# Patient Record
Sex: Female | Born: 1965 | Race: White | Hispanic: No | Marital: Married | Smoking: Never smoker
Health system: Southern US, Community
[De-identification: ages and names within clinical notes are randomized; demographics above are authoritative.]

## PROBLEM LIST (undated history)

## (undated) DIAGNOSIS — M549 Dorsalgia, unspecified: Secondary | ICD-10-CM

## (undated) DIAGNOSIS — G8929 Other chronic pain: Secondary | ICD-10-CM

## (undated) DIAGNOSIS — Q249 Congenital malformation of heart, unspecified: Secondary | ICD-10-CM

## (undated) DIAGNOSIS — T7840XA Allergy, unspecified, initial encounter: Secondary | ICD-10-CM

## (undated) DIAGNOSIS — D259 Leiomyoma of uterus, unspecified: Secondary | ICD-10-CM

## (undated) HISTORY — DX: Congenital malformation of heart, unspecified: Q24.9

## (undated) HISTORY — DX: Allergy, unspecified, initial encounter: T78.40XA

---
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
## 1997-09-08 HISTORY — PX: HYSTEROSCOPY: SHX211

## 2002-10-03 ENCOUNTER — Emergency Department (HOSPITAL_COMMUNITY): Admission: EM | Admit: 2002-10-03 | Discharge: 2002-10-03 | Payer: Self-pay | Admitting: Emergency Medicine

## 2010-05-26 LAB — HM PAP SMEAR

## 2011-03-26 ENCOUNTER — Ambulatory Visit (INDEPENDENT_AMBULATORY_CARE_PROVIDER_SITE_OTHER): Payer: BC Managed Care – PPO | Admitting: Family Medicine

## 2011-03-26 ENCOUNTER — Encounter: Payer: Self-pay | Admitting: Family Medicine

## 2011-03-26 DIAGNOSIS — J309 Allergic rhinitis, unspecified: Secondary | ICD-10-CM | POA: Insufficient documentation

## 2011-03-26 DIAGNOSIS — J3089 Other allergic rhinitis: Secondary | ICD-10-CM | POA: Insufficient documentation

## 2011-03-26 DIAGNOSIS — H1045 Other chronic allergic conjunctivitis: Secondary | ICD-10-CM

## 2011-03-26 DIAGNOSIS — J45909 Unspecified asthma, uncomplicated: Secondary | ICD-10-CM | POA: Insufficient documentation

## 2011-03-26 DIAGNOSIS — F411 Generalized anxiety disorder: Secondary | ICD-10-CM

## 2011-03-26 DIAGNOSIS — F419 Anxiety disorder, unspecified: Secondary | ICD-10-CM

## 2011-03-26 DIAGNOSIS — H1010 Acute atopic conjunctivitis, unspecified eye: Secondary | ICD-10-CM

## 2011-03-26 MED ORDER — OLOPATADINE HCL 0.1 % OP SOLN: 1.0000 [drp] | Freq: Two times a day (BID) | OPHTHALMIC | Status: AC

## 2011-03-26 MED ORDER — FLUTICASONE FUROATE 27.5 MCG/SPRAY NA SUSP: 1.0000 | Freq: Every day | NASAL | Status: DC

## 2011-03-26 NOTE — Progress Notes (Signed)
  Subjective:    Patient ID: Shelly Combs, female    DOB: Jan 12, 1966, 45 y.o.   MRN: [id number]  HPI New patient to establish care. Chronic problems include history of asthma, allergic rhinitis, allergic conjunctivitis, and intermittent anxiety. Medications reviewed. Take Asmanex one puff daily, nasal steroid spray and as needed use of Patanol eyedrops. She uses albuterol as needed for wheezing. Xanax and propranolol rarely for situational anxiety and performance anxiety. She is aware of cautions with propranolol and asthma.  Prior hysteroscopy for uterine fibroids 1999. Ganglion cyst removal 1980s. No known allergies.  Family history significant for paternal grandmother colon cancer and maternal grandmother with heart disease. 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 Nonsmoker. Drinks about 2 glasses of wine per day   Review of Systems  Constitutional: Negative for fever, chills, appetite change and unexpected weight change.  HENT: Positive for congestion and postnasal drip. Negative for sore throat and voice change.   Respiratory: Negative for cough, shortness of breath and wheezing.   Cardiovascular: Negative for chest pain.  Neurological: Negative for dizziness and weakness.  Psychiatric/Behavioral: Negative for dysphoric mood.       Objective:   Physical Exam  Constitutional: She appears well-developed and well-nourished. No distress.  HENT:  Right Ear: External ear normal.  Left Ear: External ear normal.  Mouth/Throat: Oropharynx is clear and moist. No oropharyngeal exudate.  Neck: Neck supple.  Cardiovascular: Normal rate, regular rhythm and normal heart sounds.   No murmur heard. Pulmonary/Chest: Effort normal and breath sounds normal. No respiratory distress. She has no wheezes. She has no rales.  Lymphadenopathy:    She has no cervical adenopathy.  Psychiatric: She has a normal mood and affect.          Assessment & Plan:  #1 asthma.  Generally controlled with steroid inhaler. #2 allergic rhinitis. Refilled steroid nasal inhaler. Continue over-the-counter antihistamine. #3 allergic conjunctivitis. Refilled Patanol eyedrops #4 history of performance anxiety. Continue as needed use of alprazolam and propranolol with caution for asthma

## 2011-03-26 NOTE — Patient Instructions (Signed)
Consider complete physical at some point later this year. 

## 2011-05-07 ENCOUNTER — Other Ambulatory Visit: Payer: Self-pay | Admitting: Family Medicine

## 2011-08-27 ENCOUNTER — Ambulatory Visit (INDEPENDENT_AMBULATORY_CARE_PROVIDER_SITE_OTHER): Payer: BC Managed Care – PPO | Admitting: Family Medicine

## 2011-08-27 ENCOUNTER — Encounter: Payer: Self-pay | Admitting: Family Medicine

## 2011-08-27 VITALS — BP 100/60 | HR 76 | Temp 97.9°F | Wt 120.0 lb

## 2011-08-27 DIAGNOSIS — R059 Cough, unspecified: Secondary | ICD-10-CM

## 2011-08-27 DIAGNOSIS — Z23 Encounter for immunization: Secondary | ICD-10-CM

## 2011-08-27 DIAGNOSIS — R05 Cough: Secondary | ICD-10-CM

## 2011-08-27 DIAGNOSIS — J45909 Unspecified asthma, uncomplicated: Secondary | ICD-10-CM

## 2011-08-27 MED ORDER — AZITHROMYCIN 250 MG PO TABS: ORAL_TABLET | ORAL | Status: AC

## 2011-08-27 NOTE — Patient Instructions (Signed)
Start antibiotic for any recurrent fever or worsening symptoms.

## 2011-08-27 NOTE — Progress Notes (Signed)
  Subjective:    Patient ID: Shelly Combs, female    DOB: February 14, 1966, 45 y.o.   MRN: [id number]  HPI  Acute visit. Onset about one half weeks ago of sore throat, fatigue, and body aches. She had some fever around that time and fever has resolved since then. Major issue now is cough productive of colored sputum. She has history of asthma which has been relatively stable. No significant dyspnea. No flu vaccine yet. Using rescue inhaler periodically. Her symptoms of sore throat, fever, and body aches have resolved.   Review of Systems As per history of present illness    Objective:   Physical Exam  Constitutional: She appears well-developed and well-nourished. No distress.  HENT:  Right Ear: External ear normal.  Left Ear: External ear normal.  Mouth/Throat: Oropharynx is clear and moist.  Neck: Neck supple. No thyromegaly present.  Cardiovascular: Normal rate and regular rhythm.   Pulmonary/Chest: Effort normal and breath sounds normal. No respiratory distress. She has no wheezes. She has no rales.  Lymphadenopathy:    She has no cervical adenopathy.          Assessment & Plan:  Cough probably related to recent viral bronchitis. If she has any recurrent fever or continued productive cough start Zithromax. Flu vaccine given.

## 2011-08-28 DIAGNOSIS — Z23 Encounter for immunization: Secondary | ICD-10-CM

## 2011-09-21 ENCOUNTER — Other Ambulatory Visit: Payer: Self-pay | Admitting: Family Medicine

## 2011-09-22 ENCOUNTER — Telehealth: Payer: Self-pay | Admitting: *Deleted

## 2011-09-22 MED ORDER — ALPRAZOLAM 0.25 MG PO TABS: 0.2500 mg | ORAL_TABLET | Freq: Every evening | ORAL | Status: DC | PRN

## 2011-09-22 NOTE — Telephone Encounter (Signed)
Refill OK

## 2011-09-22 NOTE — Telephone Encounter (Signed)
Rx called in 

## 2011-09-22 NOTE — Telephone Encounter (Signed)
Refill on alprazolam 0.25mg  

## 2011-09-22 NOTE — Telephone Encounter (Signed)
Rx already called in. 

## 2011-11-10 ENCOUNTER — Other Ambulatory Visit: Payer: Self-pay | Admitting: Family Medicine

## 2012-01-02 ENCOUNTER — Telehealth: Payer: Self-pay | Admitting: Family Medicine

## 2012-01-02 NOTE — Telephone Encounter (Signed)
Called and lft vm stating that pt will need to call back and sch 15 min ov with pcp. Waiting on call back.

## 2012-01-02 NOTE — Telephone Encounter (Signed)
Pt will need 15 min OV to review pt needs and fill out form.  Thank you

## 2012-01-02 NOTE — Telephone Encounter (Signed)
Pt has form that she needs to have completed by Dr Caryl Never in order to go on trip to [place]. The form is like a health assesment. Does pt need to sch cpx for this or is fup ov ok? Pt can fax the form to Dr Caryl Never for review if needed.

## 2012-01-04 ENCOUNTER — Other Ambulatory Visit: Payer: Self-pay | Admitting: Family Medicine

## 2012-01-06 NOTE — Telephone Encounter (Signed)
Left another vm stating that pt will need to sch a 15 min ov with pcp, so form can be completed. Pt is suppose to call back and sch appt.

## 2012-01-13 ENCOUNTER — Ambulatory Visit (INDEPENDENT_AMBULATORY_CARE_PROVIDER_SITE_OTHER): Payer: BC Managed Care – PPO | Admitting: Family Medicine

## 2012-01-13 ENCOUNTER — Encounter: Payer: Self-pay | Admitting: Family Medicine

## 2012-01-13 DIAGNOSIS — J45909 Unspecified asthma, uncomplicated: Secondary | ICD-10-CM

## 2012-01-13 DIAGNOSIS — F411 Generalized anxiety disorder: Secondary | ICD-10-CM

## 2012-01-13 DIAGNOSIS — F419 Anxiety disorder, unspecified: Secondary | ICD-10-CM

## 2012-01-13 DIAGNOSIS — M545 Low back pain, unspecified: Secondary | ICD-10-CM

## 2012-01-13 MED ORDER — ALPRAZOLAM 0.25 MG PO TABS: 0.2500 mg | ORAL_TABLET | Freq: Every evening | ORAL | Status: DC | PRN

## 2012-01-13 MED ORDER — MOMETASONE FUROATE 220 MCG/INH IN AEPB: 1.0000 | INHALATION_SPRAY | Freq: Every day | RESPIRATORY_TRACT | Status: DC

## 2012-01-13 NOTE — Progress Notes (Signed)
  Subjective:    Patient ID: Shelly Combs, female    DOB: 10-01-1965, 46 y.o.   MRN: [id number]  HPI  Patient here to have forms completed for upcoming travel. She plans to travel to [place]. Immunizations she thinks are up to date. She has history of asthma which has been stable on Asmanex. Requesting refills. Denies any recent cough. 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 She relates tetanus within the past 10 years.  She has history of anxiety associated with travel and alprazolam is refilled.  She has history of some chronic low back pain. She is recently stepped up her exercise and back pain is improved. [place] and require some climbing, prolonged walking, and lifting that she feels capable of this time. No recent lower extremity weakness or any neurologic deficits.  Past Medical History  Diagnosis Date  . Asthma   . Allergy   . Cardiac arrhythmia due to congenital heart disease    Past Surgical History  Procedure Date  . Hysteroscopy 1999    uterine fibroids  . Ganglion cyst excision 1980    reports that she has never smoked. She does not have any smokeless tobacco history on file. Her alcohol and drug histories not on file. family history includes Arthritis in her other; Cancer in her other and paternal grandfather; Heart disease in her other; and Mental illness in her other. No Known Allergies   Review of Systems  Constitutional: Negative for fever, activity change, appetite change, fatigue and unexpected weight change.  HENT: Negative for hearing loss, ear pain, sore throat and trouble swallowing.   Eyes: Negative for visual disturbance.  Respiratory: Negative for cough and shortness of breath.   Cardiovascular: Negative for chest pain and palpitations.  Gastrointestinal: Negative for abdominal pain, diarrhea, constipation and blood in stool.  Genitourinary: Negative for dysuria and hematuria.  Musculoskeletal: Negative for  myalgias, back pain and arthralgias.  Skin: Negative for rash.  Neurological: Negative for dizziness, syncope and headaches.  Hematological: Negative for adenopathy.  Psychiatric/Behavioral: Negative for confusion and dysphoric mood.       Objective:   Physical Exam  Constitutional: She is oriented to person, place, and time. She appears well-developed and well-nourished.  HENT:  Right Ear: External ear normal.  Left Ear: External ear normal.  Mouth/Throat: Oropharynx is clear and moist.  Neck: Neck supple. No thyromegaly present.  Cardiovascular: Normal rate and regular rhythm.   Pulmonary/Chest: Effort normal and breath sounds normal. No respiratory distress. She has no wheezes. She has no rales.  Musculoskeletal: She exhibits no edema.  Neurological: She is alert and oriented to person, place, and time.          Assessment & Plan:  #1 asthma. Stable. Refill Asmanex #2 chronic intermittent low back pain currently stable. Forms completed. No contraindications for travel  #3 situational anxiety. Refill alprazolam for as needed use for travel

## 2012-04-09 ENCOUNTER — Other Ambulatory Visit: Payer: Self-pay | Admitting: Family Medicine

## 2012-05-05 ENCOUNTER — Emergency Department (HOSPITAL_COMMUNITY): Payer: Worker's Compensation

## 2012-05-05 ENCOUNTER — Encounter (HOSPITAL_COMMUNITY): Payer: Self-pay | Admitting: Emergency Medicine

## 2012-05-05 ENCOUNTER — Emergency Department (HOSPITAL_COMMUNITY)
Admission: EM | Admit: 2012-05-05 | Discharge: 2012-05-05 | Disposition: A | Discharge status: Home / Self Care | Payer: Worker's Compensation | Attending: Emergency Medicine | Admitting: Emergency Medicine

## 2012-05-05 DIAGNOSIS — M545 Low back pain, unspecified: Secondary | ICD-10-CM | POA: Insufficient documentation

## 2012-05-05 DIAGNOSIS — IMO0002 Reserved for concepts with insufficient information to code with codable children: Secondary | ICD-10-CM | POA: Insufficient documentation

## 2012-05-05 DIAGNOSIS — M542 Cervicalgia: Secondary | ICD-10-CM | POA: Insufficient documentation

## 2012-05-05 DIAGNOSIS — S1093XA Contusion of unspecified part of neck, initial encounter: Secondary | ICD-10-CM | POA: Insufficient documentation

## 2012-05-05 DIAGNOSIS — R51 Headache: Secondary | ICD-10-CM | POA: Insufficient documentation

## 2012-05-05 DIAGNOSIS — S0003XA Contusion of scalp, initial encounter: Secondary | ICD-10-CM | POA: Insufficient documentation

## 2012-05-05 HISTORY — DX: Leiomyoma of uterus, unspecified: D25.9

## 2012-05-05 HISTORY — DX: Dorsalgia, unspecified: M54.9

## 2012-05-05 HISTORY — DX: Other chronic pain: G89.29

## 2012-05-05 MED ORDER — ACETAMINOPHEN 500 MG PO TABS: 1000.0000 mg | ORAL_TABLET | Freq: Four times a day (QID) | ORAL | Status: AC | PRN

## 2012-05-05 MED ORDER — IBUPROFEN 800 MG PO TABS
800.0000 mg | ORAL_TABLET | Freq: Once | ORAL | Status: AC
  Administered 2012-05-05: 800 mg via ORAL
  Filled 2012-05-05: qty 1

## 2012-05-05 MED ORDER — OXYCODONE HCL 5 MG PO TABS: 5.0000 mg | ORAL_TABLET | ORAL | Status: DC | PRN

## 2012-05-05 MED ORDER — IBUPROFEN 800 MG PO TABS: 800.0000 mg | ORAL_TABLET | Freq: Three times a day (TID) | ORAL | Status: AC

## 2012-05-05 MED ORDER — ACETAMINOPHEN 500 MG PO TABS: 1000.0000 mg | ORAL_TABLET | Freq: Four times a day (QID) | ORAL | Status: DC | PRN

## 2012-05-05 MED ORDER — IBUPROFEN 800 MG PO TABS: 800.0000 mg | ORAL_TABLET | Freq: Three times a day (TID) | ORAL | Status: DC

## 2012-05-05 MED ORDER — OXYCODONE-ACETAMINOPHEN 5-325 MG PO TABS: 1.0000 | ORAL_TABLET | Freq: Once | ORAL | Status: DC

## 2012-05-05 MED ORDER — CYCLOBENZAPRINE HCL 5 MG PO TABS: 5.0000 mg | ORAL_TABLET | Freq: Three times a day (TID) | ORAL | Status: AC | PRN
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
## 2012-05-05 NOTE — ED Notes (Signed)
Pt to radiology at this time.

## 2012-05-14 ENCOUNTER — Encounter: Payer: Self-pay | Admitting: Family Medicine

## 2012-05-14 ENCOUNTER — Ambulatory Visit (INDEPENDENT_AMBULATORY_CARE_PROVIDER_SITE_OTHER): Payer: Worker's Compensation | Admitting: Family Medicine

## 2012-05-14 VITALS — BP 124/80 | Temp 98.0°F | Wt 121.0 lb

## 2012-05-14 DIAGNOSIS — S060X9A Concussion with loss of consciousness of unspecified duration, initial encounter: Secondary | ICD-10-CM

## 2012-05-14 DIAGNOSIS — S5000XA Contusion of unspecified elbow, initial encounter: Secondary | ICD-10-CM

## 2012-05-14 DIAGNOSIS — M545 Low back pain, unspecified: Secondary | ICD-10-CM

## 2012-05-14 DIAGNOSIS — S300XXA Contusion of lower back and pelvis, initial encounter: Secondary | ICD-10-CM

## 2012-05-14 DIAGNOSIS — S060XAA Concussion with loss of consciousness status unknown, initial encounter: Secondary | ICD-10-CM

## 2012-05-14 NOTE — Patient Instructions (Addendum)
Concussion and Brain Injury A blow or jolt to the head can disrupt the normal function of the brain. This type of brain injury is often called a "concussion" or a "closed head injury." Concussions are usually not life-threatening. Even so, the effects of a concussion can be serious.  CAUSES  A concussion is caused by a blunt blow to the head. The blow might be direct or indirect as described below.  Direct blow (running into another player during a soccer game, being hit in a fight, or hitting your head on a hard surface).   Indirect blow (when your head moves rapidly and violently back and forth like in a car crash).  SYMPTOMS  The brain is very complex. Every head injury is different. Some symptoms may appear right away. Other symptoms may not show up for days or weeks after the concussion. The signs of concussion can be hard to notice. Early on, problems may be missed by patients, family members, and caregivers. You may look fine even though you are acting or feeling differently.  These symptoms are usually temporary, but may last for days, weeks, or even longer. Symptoms include:  Mild headaches that will not go away.   Having more trouble than usual with:   Remembering things.   Paying attention or concentrating.   Organizing daily tasks.   Making decisions and solving problems.   Slowness in thinking, acting, speaking, or reading.   Getting lost or easily confused.   Feeling tired all the time or lacking energy (fatigue).   Feeling drowsy.   Sleep disturbances.   Sleeping more than usual.   Sleeping less than usual.   Trouble falling asleep.   Trouble sleeping (insomnia).   Loss of balance or feeling lightheaded or dizzy.   Nausea or vomiting.   Numbness or tingling.   Increased sensitivity to:   Sounds.   Lights.   Distractions.  Other symptoms might include:  Vision problems or eyes that tire easily.   Diminished sense of taste or smell.   Ringing  in the ears.   Mood changes such as feeling sad, anxious, or listless.   Becoming easily irritated or angry for little or no reason.   Lack of motivation.  DIAGNOSIS  Your caregiver can usually diagnose a concussion or mild brain injury based on your description of your injury and your symptoms.  Your evaluation might include:  A brain scan to look for signs of injury to the brain. Even if the test shows no injury, you may still have a concussion.   Blood tests to be sure other problems are not present.  TREATMENT   People with a concussion need to be examined and evaluated. Most people with concussions are treated in an emergency department, urgent care, or clinic. Some people must stay in the hospital overnight for further treatment.   Your caregiver will send you home with important instructions to follow. Be sure to carefully follow them.   Tell your caregiver if you are already taking any medicines (prescription, over-the-counter, or natural remedies), or if you are drinking alcohol or taking illegal drugs. Also, talk with your caregiver if you are taking blood thinners (anticoagulants) or aspirin. These drugs may increase your chances of complications. All of this is important information that may affect treatment.   Only take over-the-counter or prescription medicines for pain, discomfort, or fever as directed by your caregiver.  PROGNOSIS  How fast people recover from brain injury varies from person to person.   Although most people have a good recovery, how quickly they improve depends on many factors. These factors include how severe their concussion was, what part of the brain was injured, their age, and how healthy they were before the concussion.  Because all head injuries are different, so is recovery. Most people with mild injuries recover fully. Recovery can take time. In general, recovery is slower in older persons. Also, persons who have had a concussion in the past or have  other medical problems may find that it takes longer to recover from their current injury. Anxiety and depression may also make it harder to adjust to the symptoms of brain injury. HOME CARE INSTRUCTIONS  Return to your normal activities slowly, not all at once. You must give your body and brain enough time for recovery.  Get plenty of sleep at night, and rest during the day. Rest helps the brain to heal.   Avoid staying up late at night.   Keep the same bedtime hours on weekends and weekdays.   Take daytime naps or rest breaks when you feel tired.   Limit activities that require a lot of thought or concentration (brain or cognitive rest). This includes:   Homework or job-related work.   Watching TV.   Computer work.   Avoid activities that could lead to a second brain injury, such as contact or recreational sports, until your caregiver says it is okay. Even after your brain injury has healed, you should protect yourself from having another concussion.   Ask your caregiver when you can return to your normal activities such as driving, bicycling, or operating heavy equipment. Your ability to react may be slower after a brain injury.   Talk with your caregiver about when you can return to work or school.   Inform your teachers, school nurse, school counselor, coach, athletic trainer, or work manager about your injury, symptoms, and restrictions. They should be instructed to report:   Increased problems with attention or concentration.   Increased problems remembering or learning new information.   Increased time needed to complete tasks or assignments.   Increased irritability or decreased ability to cope with stress.   Increased symptoms.   Take only those medicines that your caregiver has approved.   Do not drink alcohol until your caregiver says you are well enough to do so. Alcohol and certain other drugs may slow your recovery and can put you at risk of further injury.    If it is harder than usual to remember things, write them down.   If you are easily distracted, try to do one thing at a time. For example, do not try to watch TV while fixing dinner.   Talk with family members or close friends when making important decisions.   Keep all follow-up appointments. Repeated evaluation of your symptoms is recommended for your recovery.  PREVENTION  Protect your head from future injury. It is very important to avoid another head or brain injury before you have recovered. In rare cases, another injury has lead to permanent brain damage, brain swelling, or death. Avoid injuries by using:  Seatbelts when riding in a car.   Alcohol only in moderation.   A helmet when biking, skiing, skateboarding, skating, or doing similar activities.   Safety measures in your home.   Remove clutter and tripping hazards from floors and stairways.   Use grab bars in bathrooms and handrails by stairs.   Place non-slip mats on floors and in bathtubs.     Improve lighting in dim areas.  SEEK MEDICAL CARE IF:  A head injury can cause lingering symptoms. You should seek medical care if you have any of the following symptoms for more than 3 weeks after your injury or are planning to return to sports:  Chronic headaches.   Dizziness or balance problems.   Nausea.   Vision problems.   Increased sensitivity to noise or light.   Depression or mood swings.   Anxiety or irritability.   Memory problems.   Difficulty concentrating or paying attention.   Sleep problems.   Feeling tired all the time.  SEEK IMMEDIATE MEDICAL CARE IF:  You have had a blow or jolt to the head and you (or your family or friends) notice:  Severe or worsening headaches.   Weakness (even if only in one hand or one leg or one part of the face), numbness, or decreased coordination.   Repeated vomiting.   Increased sleepiness or passing out.   One black center of the eye (pupil) is larger  than the other.   Convulsions (seizures).   Slurred speech.   Increasing confusion, restlessness, agitation, or irritability.   Lack of ability to recognize people or places.   Neck pain.   Difficulty being awakened.   Unusual behavior changes.   Loss of consciousness.  Older adults with a brain injury may have a higher risk of serious complications such as a blood clot on the brain. Headaches that get worse or an increase in confusion are signs of this complication. If these signs occur, see a caregiver right away. MAKE SURE YOU:   Understand these instructions.   Will watch your condition.   Will get help right away if you are not doing well or get worse.  FOR MORE INFORMATION  Several groups help people with brain injury and their families. They provide information and put people in touch with local resources. These include support groups, rehabilitation services, and a variety of health care professionals. Among these groups, the Brain Injury Association (BIA, www.biausa.org) has a national office that gathers scientific and educational information and works on a national level to help people with brain injury.  Document Released: 11/15/2003 Document Revised: 08/14/2011 Document Reviewed: 04/12/2008 ExitCare Patient Information 2012 [employer]. 
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
## 2012-08-11 ENCOUNTER — Telehealth: Payer: Self-pay | Admitting: Family Medicine
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
## 2012-08-12 NOTE — Telephone Encounter (Signed)
Shelly Combs, disposition says to see Dr. Caryl Never in next 2wks. It says office does not need to f/u w/patient, but no appt is made. Please advise if 2wks is soon enough, and I'm happy to make appt to discuss BP. Thanks.

## 2012-08-12 NOTE — Telephone Encounter (Signed)
I spoke w/ Dr. Caryl Never. @wks  is ok. Called pt and LMOM to call and sched appt.

## 2012-08-18 ENCOUNTER — Other Ambulatory Visit: Payer: Self-pay | Admitting: Family Medicine

## 2012-08-19 NOTE — Telephone Encounter (Signed)
Refill once OK. 

## 2012-08-19 NOTE — Telephone Encounter (Signed)
Alprazolam as needed last filled 01-13-12, #30 with 0 refills

## 2012-09-17 ENCOUNTER — Ambulatory Visit (INDEPENDENT_AMBULATORY_CARE_PROVIDER_SITE_OTHER): Payer: Worker's Compensation | Admitting: Family Medicine

## 2012-09-17 ENCOUNTER — Encounter: Payer: Self-pay | Admitting: Family Medicine

## 2012-09-17 VITALS — BP 120/80 | Temp 99.4°F | Wt 120.0 lb

## 2012-09-17 DIAGNOSIS — F4310 Post-traumatic stress disorder, unspecified: Secondary | ICD-10-CM

## 2012-09-17 DIAGNOSIS — E041 Nontoxic single thyroid nodule: Secondary | ICD-10-CM

## 2012-09-17 DIAGNOSIS — F411 Generalized anxiety disorder: Secondary | ICD-10-CM

## 2012-09-17 DIAGNOSIS — R209 Unspecified disturbances of skin sensation: Secondary | ICD-10-CM

## 2012-09-17 DIAGNOSIS — F419 Anxiety disorder, unspecified: Secondary | ICD-10-CM

## 2012-09-17 DIAGNOSIS — R202 Paresthesia of skin: Secondary | ICD-10-CM

## 2012-09-17 LAB — BASIC METABOLIC PANEL
CO2: 27 mEq/L (ref 19–32)
Chloride: 110 mEq/L (ref 96–112)
Creatinine, Ser: 0.9 mg/dL (ref 0.4–1.2)

## 2012-09-17 LAB — SEDIMENTATION RATE: Sed Rate: 9 mm/hr (ref 0–22)

## 2012-09-17 LAB — VITAMIN B12: Vitamin B-12: 349 pg/mL (ref 211–911)
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
## 2012-09-21 NOTE — Progress Notes (Signed)
Quick Note:  Pt informed on VM ______ 

## 2012-09-22 ENCOUNTER — Ambulatory Visit
Admission: RE | Admit: 2012-09-22 | Discharge: 2012-09-22 | Disposition: A | Discharge status: Home / Self Care | Payer: BC Managed Care – PPO | Source: Ambulatory Visit | Attending: Family Medicine | Admitting: Family Medicine

## 2012-09-22 DIAGNOSIS — E041 Nontoxic single thyroid nodule: Secondary | ICD-10-CM

## 2013-03-21 ENCOUNTER — Telehealth: Payer: Self-pay | Admitting: Family Medicine

## 2013-03-21 MED ORDER — MOMETASONE FUROATE 220 MCG/INH IN AEPB: 2.0000 | INHALATION_SPRAY | Freq: Every day | RESPIRATORY_TRACT | Status: DC

## 2013-03-21 NOTE — Telephone Encounter (Signed)
Left detailed message to advise pt of PCP's note. Rx sent to pharmacy

## 2013-03-21 NOTE — Telephone Encounter (Signed)
Patient Information:  Caller Name: Shelly Combs  Phone: [phone]  Patient: Shelly Combs, Shelly Combs  Gender: Female  DOB: 1966-02-27  Age: 47 Years  PCP: Evelena Peat (Family Practice)  Pregnant: No  Office Follow Up:  Does the office need to follow up with this patient?: Yes  Instructions For The Office: PLS READ RN NOTE  RN Note:  Pt started having wheezing at night before bed for last 2 weeks, Pt has been using her rescue inhaler.  States her insurance stopped covering Asmanex, Pt is willing to pay out-of-pocket because the med worked the best for her. Pt is headed out of town for 1 month, leaving on 7-19, and would like to have something else besides her rescue inhaler.  Last OV was 09-17-12.  All emergent symptoms ruled out per Asthma protocol. Pt will make appt w/ office once she returns from travel.  Pt uses [employer], [facility]iation [employer] practitioner, info in [place].  PLEASE REVIEW W/ DR Caryl Never AND F/U W/ PT.  Symptoms  Reason For Call & Symptoms: Asthma, wheezing at night  Reviewed Health History In EMR: Yes  Reviewed Medications In EMR: Yes  Reviewed Allergies In EMR: Yes  Reviewed Surgeries / Procedures: Yes  Date of Onset of Symptoms: 03/07/2013  Treatments Tried: Asmanex and Proventil  Treatments Tried Worked: No OB / GYN:  LMP: 02/28/2013  Guideline(s) Used:  Asthma Attack  Disposition Per Guideline:   See Today or Tomorrow in Office  Reason For Disposition Reached:   Mild asthma attack (e.g., no SOB at rest, mild SOB with walking, speaks normally in sentences, mild wheezing) and persists > 24 hours on appropriate treatment  Advice Given:  N/A  RN Overrode Recommendation:  Make Appointment  Pt will make appt w/ office once she is back from travel.

## 2013-03-21 NOTE — Telephone Encounter (Signed)
Refill asmanex for 6 months.  If she finds that her insurance pays for an alternative steroid at lower co-pay let me know

## 2013-03-21 NOTE — Telephone Encounter (Signed)
Called and left message for patient to return call.  

## 2013-04-21 NOTE — Telephone Encounter (Signed)
Insurance still will not cover the [employer], unless she tries a covered med. I saw per note that patient is willing to pay cash for the Asmanex. However, if she wants to try one of the meds they will cover, and therefore should be a cheaper co-pay, they are:  Flovent HFA/Diskus Pulmicort Qvar  Thank you.

## 2013-04-21 NOTE — Telephone Encounter (Signed)
Try Qvar 80 mcg/inh one inhalation twice daily. Refill for 6 months.

## 2013-04-22 MED ORDER — BECLOMETHASONE DIPROPIONATE 80 MCG/ACT IN AERS: 1.0000 | INHALATION_SPRAY | Freq: Two times a day (BID) | RESPIRATORY_TRACT | Status: DC

## 2013-04-22 NOTE — Addendum Note (Signed)
Addended by: Shelby Dubin E on: 04/22/2013 11:24 AM   Modules accepted: Orders

## 2013-04-22 NOTE — Telephone Encounter (Signed)
Montrice, please see below. Thank you.

## 2013-04-22 NOTE — Telephone Encounter (Signed)
Rx sent to pharmacy   

## 2013-07-20 ENCOUNTER — Other Ambulatory Visit: Payer: Self-pay | Admitting: Family Medicine

## 2014-03-31 ENCOUNTER — Encounter: Payer: Self-pay | Admitting: Family Medicine

## 2014-03-31 ENCOUNTER — Telehealth: Payer: Self-pay | Admitting: Family Medicine

## 2014-03-31 ENCOUNTER — Ambulatory Visit (INDEPENDENT_AMBULATORY_CARE_PROVIDER_SITE_OTHER): Payer: BC Managed Care – PPO | Admitting: Family Medicine

## 2014-03-31 VITALS — BP 110/80 | HR 81 | Wt 130.0 lb

## 2014-03-31 DIAGNOSIS — R002 Palpitations: Secondary | ICD-10-CM

## 2014-03-31 DIAGNOSIS — R5383 Other fatigue: Secondary | ICD-10-CM

## 2014-03-31 DIAGNOSIS — F411 Generalized anxiety disorder: Secondary | ICD-10-CM

## 2014-03-31 DIAGNOSIS — L659 Nonscarring hair loss, unspecified: Secondary | ICD-10-CM

## 2014-03-31 DIAGNOSIS — F419 Anxiety disorder, unspecified: Secondary | ICD-10-CM

## 2014-03-31 DIAGNOSIS — R5381 Other malaise: Secondary | ICD-10-CM

## 2014-03-31 MED ORDER — CITALOPRAM HYDROBROMIDE 10 MG PO TABS: 10.0000 mg | ORAL_TABLET | Freq: Every day | ORAL | Status: DC

## 2014-03-31 NOTE — Progress Notes (Signed)
Pre visit review using our clinic review tool, if applicable. No additional management support is needed unless otherwise documented below in the visit note. 
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
## 2014-03-31 NOTE — Patient Instructions (Signed)
Minimize caffeine use We will call you regarding lab work

## 2014-03-31 NOTE — Telephone Encounter (Signed)
Caller: Sherrelle/Patient; Patient Name: Siri Cole; PCP: [facility]); Best Callback Phone Number: [phone] Afebrile, LMP - 03/17/14 Onset:03/16/14 increased palpitations/fluttering with heart that patient states she has felt before even as a child however over the last few weeks have become more frequent and is now occurring on a daily basis. Has a funny feeling when it happens which is  multiple times throughout the day, lasting 30 secs - 1 min. Mouth is dry, has been able to urinate in the last 8 hours.   Had espresso this morning. Drinks wine 12 ounces a day. Also having times where she sweats and has hot flashes, hair is falling out more than normal.  Urgent symptom of "Persistent rapid pulse rate more than 100 beats per minute and unusual sweating or unexplained weight loss that has not been previously evaluated" per Irregular Heartbeat protocol.  Disposition See Provider within 24 hours.      Appointment scheduled for evaluation today 03/31/14 at 15:45 with Dr. Elease Hashimoto for evaluation.

## 2014-04-01 LAB — TSH: TSH: 2.28 u[IU]/mL (ref 0.350–4.500)

## 2014-04-21 ENCOUNTER — Encounter: Payer: Self-pay | Admitting: Family Medicine

## 2014-04-21 ENCOUNTER — Ambulatory Visit (INDEPENDENT_AMBULATORY_CARE_PROVIDER_SITE_OTHER): Payer: BC Managed Care – PPO | Admitting: Family Medicine

## 2014-04-21 VITALS — BP 120/80 | HR 84 | Wt 129.0 lb

## 2014-04-21 DIAGNOSIS — F411 Generalized anxiety disorder: Secondary | ICD-10-CM

## 2014-04-21 DIAGNOSIS — F418 Other specified anxiety disorders: Secondary | ICD-10-CM

## 2014-04-21 DIAGNOSIS — F419 Anxiety disorder, unspecified: Secondary | ICD-10-CM

## 2014-04-21 DIAGNOSIS — J3089 Other allergic rhinitis: Secondary | ICD-10-CM

## 2014-04-21 MED ORDER — AZELASTINE HCL 0.1 % NA SOLN: 2.0000 | Freq: Two times a day (BID) | NASAL | Status: DC

## 2014-04-21 MED ORDER — PROPRANOLOL HCL 10 MG PO TABS: ORAL_TABLET | ORAL | Status: DC

## 2014-04-21 NOTE — Progress Notes (Signed)
Pre visit review using our clinic review tool, if applicable. No additional management support is needed unless otherwise documented below in the visit note. 

## 2014-04-21 NOTE — Progress Notes (Signed)
   Subjective:    Patient ID: Shelly Combs, female    DOB: November 02, 1965, 48 y.o.   MRN: [id number]  HPI Patient seen for followup  Refer to prior note. She had multiple vague symptoms including fatigue and palpitations. Multiple years of underlying anxiety. We elected to start low-dose citalopram 10 mg daily. She is seeing great improvement in her anxiety symptoms. Still some sleep difficulties. She has tapered off of caffeine. She's also noticed great improvement in mood. Fewer palpitations. Recent TSH normal  History of performance anxiety with speaking. Has used low-dose inderal in the past. Requesting refills  Ongoing allergy symptoms. She's taking Claritin along with nasal steroids and still has frequent postnasal drip symptoms. No infectious symptoms. No headaches.   Review of Systems  Constitutional: Negative for fever and chills.  HENT: Positive for congestion.   Respiratory: Negative for cough and shortness of breath.   Psychiatric/Behavioral: Negative for dysphoric mood.       Objective:   Physical Exam  Constitutional: She appears well-developed and well-nourished.  Neck: Neck supple. No thyromegaly present.  Cardiovascular: Normal rate and regular rhythm.  Exam reveals no gallop.   No murmur heard. Pulmonary/Chest: Effort normal and breath sounds normal. No respiratory distress. She has no wheezes. She has no rales.  Musculoskeletal: She exhibits no edema.          Assessment & Plan:  #1 anxiety. Improved with citalopram. Continue current dosing. #2 perennial allergies. Try Astelin nasal 1-2 sprays per nostril once or twice daily #3 performance anxiety. Refill Inderal for as needed use

## 2014-04-23 DIAGNOSIS — F418 Other specified anxiety disorders: Secondary | ICD-10-CM | POA: Insufficient documentation

## 2014-04-26 ENCOUNTER — Other Ambulatory Visit: Payer: Self-pay | Admitting: Family Medicine

## 2014-10-06 ENCOUNTER — Other Ambulatory Visit: Payer: Self-pay | Admitting: Family Medicine

## 2014-10-21 ENCOUNTER — Other Ambulatory Visit: Payer: Self-pay | Admitting: Family Medicine

## 2015-04-10 ENCOUNTER — Other Ambulatory Visit: Payer: Self-pay | Admitting: Family Medicine

## 2015-05-07 ENCOUNTER — Other Ambulatory Visit: Payer: Self-pay | Admitting: Family Medicine

## 2015-05-21 ENCOUNTER — Other Ambulatory Visit: Payer: Self-pay | Admitting: Family Medicine

## 2015-07-02 ENCOUNTER — Other Ambulatory Visit: Payer: Self-pay | Admitting: Family Medicine

## 2015-07-06 ENCOUNTER — Ambulatory Visit (INDEPENDENT_AMBULATORY_CARE_PROVIDER_SITE_OTHER): Payer: BC Managed Care – PPO | Admitting: Family Medicine

## 2015-07-06 VITALS — BP 128/82 | HR 96 | Temp 98.3°F | Wt 131.2 lb

## 2015-07-06 DIAGNOSIS — F419 Anxiety disorder, unspecified: Secondary | ICD-10-CM

## 2015-07-06 MED ORDER — CITALOPRAM HYDROBROMIDE 20 MG PO TABS: 20.0000 mg | ORAL_TABLET | Freq: Every day | ORAL | Status: DC

## 2015-07-06 NOTE — Progress Notes (Signed)
   Subjective:    Patient ID: Shelly Combs, female    DOB: September 16, 1965, 49 y.o.   MRN: [id number]  HPI   Patient here to follow-up regarding anxiety. She was unfortunately fired from her job at [employer] last year and had difficulties coping with that. She's had some depressive symptoms since then. She is doing yoga frequently and just recently started some regular walking. She currently takes Celexa 10 mg and wonders about titration. She continues to see gynecologist regularly. No suicidal ideation.  Past Medical History  Diagnosis Date  . Asthma   . Allergy   . Cardiac arrhythmia due to congenital heart disease   . Chronic back pain   . Uterine fibroid    Past Surgical History  Procedure Laterality Date  . Hysteroscopy  1999    uterine fibroids  . Ganglion cyst excision  1980    reports that she has never smoked. She does not have any smokeless tobacco history on file. She reports that she drinks about 1.2 oz of alcohol per week. She reports that she does not use illicit drugs. family history includes Arthritis in her other; Cancer in her other and paternal grandfather; Heart disease in her other; Mental illness in her other. No Known Allergies    Review of Systems  Constitutional: Negative for appetite change and unexpected weight change.  Respiratory: Negative for shortness of breath.   Cardiovascular: Negative for chest pain.  Psychiatric/Behavioral: Positive for dysphoric mood. Negative for agitation. The patient is nervous/anxious.        Objective:   Physical Exam  Constitutional: She appears well-developed and well-nourished. No distress.  Cardiovascular: Normal rate and regular rhythm.   Pulmonary/Chest: Effort normal and breath sounds normal. No respiratory distress. She has no wheezes. She has no rales.  Psychiatric: She has a normal mood and affect. Her behavior is normal.          Assessment & Plan:  Anxiety and depression. PH Q-9 score 14. Titrate Celexa to  20 mg once daily. Prescription written. Touch base in one month regarding how she is doing with this increase in dosage.

## 2015-07-06 NOTE — Progress Notes (Signed)
Pre visit review using our clinic review tool, if applicable. No additional management support is needed unless otherwise documented below in the visit note. 

## 2015-08-30 ENCOUNTER — Other Ambulatory Visit: Payer: Self-pay | Admitting: Family Medicine

## 2015-11-28 ENCOUNTER — Other Ambulatory Visit: Payer: Self-pay | Admitting: *Deleted

## 2015-11-28 MED ORDER — BECLOMETHASONE DIPROPIONATE 80 MCG/ACT IN AERS: INHALATION_SPRAY | RESPIRATORY_TRACT | Status: DC

## 2015-11-28 NOTE — Telephone Encounter (Signed)
Rx done. 

## 2015-12-20 ENCOUNTER — Ambulatory Visit (INDEPENDENT_AMBULATORY_CARE_PROVIDER_SITE_OTHER): Payer: BC Managed Care – PPO | Admitting: Family Medicine

## 2015-12-20 VITALS — BP 106/80 | HR 73 | Temp 98.3°F | Ht 61.25 in | Wt 134.1 lb

## 2015-12-20 DIAGNOSIS — J4540 Moderate persistent asthma, uncomplicated: Secondary | ICD-10-CM | POA: Diagnosis not present

## 2015-12-20 MED ORDER — MONTELUKAST SODIUM 10 MG PO TABS: 10.0000 mg | ORAL_TABLET | Freq: Every day | ORAL | Status: DC

## 2015-12-20 NOTE — Patient Instructions (Signed)
Let me know in next few weeks if still wheezing on the Singulair.

## 2015-12-20 NOTE — Progress Notes (Signed)
Pre visit review using our clinic review tool, if applicable. No additional management support is needed unless otherwise documented below in the visit note. 

## 2015-12-20 NOTE — Progress Notes (Signed)
   Subjective:    Patient ID: Shelly Combs, female    DOB: Jun 24, 1966, 50 y.o.   MRN: [id number]  HPI  Patient seen with concern over possible medication side effect  She has asthma which is probably at least moderate persistent.  She is taking Qvar for a long time now but his had some recent palpitations which she states are fairly consistently 1 hour after taking medication. She has not noted the side effects after taking albuterol.   After stopping her Qvar couple weeks ago she has not had any symptoms (of palpitations). She would like to explore other options. When she is not taking the Qvar she tends to have wheezing at least a few days per week. Also less frequent wheezing at night.  Past Medical History  Diagnosis Date  . Asthma   . Allergy   . Cardiac arrhythmia due to congenital heart disease   . Chronic back pain   . Uterine fibroid    Past Surgical History  Procedure Laterality Date  . Hysteroscopy  1999    uterine fibroids  . Ganglion cyst excision  1980    reports that she has never smoked. She does not have any smokeless tobacco history on file. She reports that she drinks about 1.2 oz of alcohol per week. She reports that she does not use illicit drugs. family history includes Arthritis in her other; Cancer in her other and paternal grandfather; Heart disease in her other; Mental illness in her other. No Known Allergies    Review of Systems  Constitutional: Negative for fever and chills.  Respiratory: Positive for wheezing. Negative for cough.   Cardiovascular: Negative for chest pain.       Objective:   Physical Exam  Constitutional: She appears well-developed and well-nourished.  Neck: Neck supple. No thyromegaly present.  Cardiovascular: Normal rate and regular rhythm.   Pulmonary/Chest: Effort normal and breath sounds normal. No respiratory distress. She has no wheezes. She has no rales.  Lymphadenopathy:    She has no cervical adenopathy.         Assessment & Plan:   History of asthma. Has probably at least moderate persistent asthma. She is reluctant to consider other steroid inhalers at this point. She is reporting possible palpitations which we explained are not common with steroid inhalers. She is fairly certain this does not correlate with her beta agonist use. We did explore option of Singulair though we explained that this is not equivalent to steroid inhaler by any means. She will try singular 10 mg once daily. If we are not seeing good suppression of her asthma over the next few weeks consider trying another type of steroid inhaler such as Flovent or Pulmicort

## 2016-02-29 ENCOUNTER — Encounter: Payer: Self-pay | Admitting: Family Medicine

## 2016-02-29 ENCOUNTER — Ambulatory Visit (INDEPENDENT_AMBULATORY_CARE_PROVIDER_SITE_OTHER): Payer: BC Managed Care – PPO | Admitting: Family Medicine

## 2016-02-29 VITALS — BP 100/78 | HR 66 | Temp 98.4°F | Ht 61.25 in | Wt 136.0 lb

## 2016-02-29 DIAGNOSIS — L719 Rosacea, unspecified: Secondary | ICD-10-CM | POA: Diagnosis not present

## 2016-02-29 MED ORDER — METRONIDAZOLE 0.75 % EX CREA: TOPICAL_CREAM | Freq: Two times a day (BID) | CUTANEOUS | Status: DC

## 2016-02-29 NOTE — Progress Notes (Signed)
   Subjective:    Patient ID: Shelly Combs, female    DOB: [birth year], 50 y.o.   MRN: [id number]  HPI Skin rash of the face. Right nasal area greater than left. She noticed some increased redness recently. No itching. No bleeding. Nontender. Sister has history of rosacea. Patient describes occasional erythematous papules especially in her chin region. Occasional pustules. Occasionally exacerbated by alcohol. She has not noted any scaly rash.  Past Medical History  Diagnosis Date  . Asthma   . Allergy   . Cardiac arrhythmia due to congenital heart disease   . Chronic back pain   . Uterine fibroid    Past Surgical History  Procedure Laterality Date  . Hysteroscopy  1999    uterine fibroids  . Ganglion cyst excision  1980    reports that she has never smoked. She does not have any smokeless tobacco history on file. She reports that she drinks about 1.2 oz of alcohol per week. She reports that she does not use illicit drugs. family history includes Arthritis in her other; Atrial fibrillation in her mother; Cancer in her other and paternal grandfather; Heart disease in her other; Mental illness in her other. No Known Allergies    Review of Systems  Skin: Positive for rash.       Objective:   Physical Exam  Constitutional: She appears well-developed and well-nourished.  Cardiovascular: Normal rate and regular rhythm.   Skin: Rash noted.  Patient has some erythema involving both the right and left nasal region-near the nasolabial folds. Non-scaly. Very few telangiectasias. No nodules.          Assessment & Plan:  Macular erythema of the nose. Question rosacea. This seems especially more likely with positive family history. She does not have any scaly features to suggest seborrheic dermatitis. We discussed options. Doxycycline would be high risk for side effects because of her fair skin. We have recommended trial of metronidazole cream 0.75% twice daily. We reviewed possible  triggers. Touch base if not improving in one month  Eulas Post MD [facility]

## 2016-02-29 NOTE — Patient Instructions (Signed)
Rosacea Rosacea is a long-term (chronic) condition that affects the skin of the face, including the cheeks, nose, brow, and chin. This condition can also affect the eyes. Rosacea causes blood vessels near the surface of the skin to enlarge, which results in redness. CAUSES The cause of this condition is not known. Certain triggers can make rosacea worse, including:  Hot baths.  Exercise.  Sunlight.  Very hot or cold temperatures.  Hot or spicy foods and drinks.  Drinking alcohol.  Stress.  Taking blood pressure medicine.  Long-term use of topical steroids on the face. RISK FACTORS This condition is more likely to develop in:  People who are older than 50 years of age.  Women.  People who have light-colored skin (light complexion).  People who have a family history of rosacea. SYMPTOMS  Symptoms of this condition include:  Redness of the face.  Red bumps or pimples on the face.  A red, enlarged nose.  Blushing easily.  Red lines on the skin.  Irritated or burning feeling in the eyes.  Swollen eyelids. DIAGNOSIS This condition is diagnosed with a medical history and physical exam. TREATMENT There is no cure for this condition, but treatment can help to control your symptoms. Your health care provider may recommend that you see a skin specialist (dermatologist). Treatment may include:  Antibiotic medicines that are applied to the skin or taken as a pill.  Laser treatment to improve the appearance of the skin.  Surgery. This is rare. Your health care provider will also recommend the best way to take care of your skin. Even after your skin improves, you will likely need to continue treatment to prevent your rosacea from coming back. HOME CARE INSTRUCTIONS Skin Care Take care of your skin as told by your health care provider. You may be told to do these things:  Wash your skin gently two or more times each day.  Use mild soap.  Use a sunscreen or sunblock  with SPF 30 or greater.  Use gentle cosmetics that are meant for sensitive skin.  Shave with an electric shaver instead of a blade. Lifestyle  Try to keep track of what foods trigger this condition. Avoid any triggers. These may include:  Spicy foods.  Seafood.  Cheese.  Hot liquids.  Nuts.  Chocolate.  Iodized salt.  Do not drink alcohol.  Avoid extremely cold or hot temperatures.  Try to reduce your stress. If you need help, talk with your health care provider.  When you exercise, do these things to stay cool:  Limit your sun exposure.  Use a fan.  Do shorter and more frequent intervals of exercise. General Instructions   Keep all follow-up visits as told by your health care provider. This is important.  Take over-the-counter and prescription medicines only as told by your health care provider.  If your eyelids are affected, apply warm compresses to them. Do this as told by your health care provider.  If you were prescribed an antibiotic medicine, apply or take it as told by your health care provider. Do not stop using the antibiotic even if your condition improves. SEEK MEDICAL CARE IF:  Your symptoms get worse.  Your symptoms do not improve after two months of treatment.  You have new symptoms.  You have any changes in vision or you have problems with your eyes, such as redness or itching.  You feel depressed.  You lose your appetite.  You have trouble concentrating.   This information is not  intended to replace advice given to you by your health care provider. Make sure you discuss any questions you have with your health care provider.   Document Released: 10/02/2004 Document Revised: 05/16/2015 Document Reviewed: 11/01/2014 Elsevier Interactive Patient Education [employer].

## 2016-02-29 NOTE — Progress Notes (Signed)
Pre visit review using our clinic review tool, if applicable. No additional management support is needed unless otherwise documented below in the visit note. 

## 2016-06-19 ENCOUNTER — Ambulatory Visit (INDEPENDENT_AMBULATORY_CARE_PROVIDER_SITE_OTHER)
Admission: RE | Admit: 2016-06-19 | Discharge: 2016-06-19 | Disposition: A | Discharge status: Home / Self Care | Payer: BC Managed Care – PPO | Source: Ambulatory Visit | Attending: Internal Medicine | Admitting: Internal Medicine

## 2016-06-19 ENCOUNTER — Encounter: Payer: Self-pay | Admitting: Internal Medicine

## 2016-06-19 ENCOUNTER — Ambulatory Visit (INDEPENDENT_AMBULATORY_CARE_PROVIDER_SITE_OTHER): Payer: BC Managed Care – PPO | Admitting: Internal Medicine

## 2016-06-19 VITALS — BP 102/60 | Temp 98.4°F | Wt 136.8 lb

## 2016-06-19 DIAGNOSIS — E559 Vitamin D deficiency, unspecified: Secondary | ICD-10-CM

## 2016-06-19 DIAGNOSIS — M7989 Other specified soft tissue disorders: Secondary | ICD-10-CM | POA: Diagnosis not present

## 2016-06-19 DIAGNOSIS — Z23 Encounter for immunization: Secondary | ICD-10-CM

## 2016-06-19 DIAGNOSIS — M79672 Pain in left foot: Secondary | ICD-10-CM | POA: Diagnosis not present

## 2016-06-19 NOTE — Progress Notes (Signed)
   No chief complaint on file.   HPI: Shelly Combs 50 y.o.  ROS: See pertinent positives and negatives per HPI.  Past Medical History:  Diagnosis Date  . Allergy   . Asthma   . Cardiac arrhythmia due to congenital heart disease   . Chronic back pain   . Uterine fibroid     Family History  Problem Relation Age of Onset  . Arthritis Other   . Cancer Other     colon  . Heart disease Other   . Mental illness Other   . Cancer Paternal Grandfather     colon  . Atrial fibrillation Mother     Social History   Social History  . Marital status: Married    Spouse name: N/A  . Number of children: N/A  . Years of education: N/A   Social History Main Topics  . Smoking status: Never Smoker  . Smokeless tobacco: Not on file  . Alcohol use 1.2 oz/week    2 Glasses of wine per week  . Drug use: No  . Sexual activity: Not on file   Other Topics Concern  . Not on file   Social History Narrative  . No narrative on file    Outpatient Medications Prior to Visit  Medication Sig Dispense Refill  . ALPRAZolam (XANAX) 0.25 MG tablet take 1 tablet by mouth as directed FOR FLYING 30 tablet 0  . azelastine (ASTELIN) 0.1 % nasal spray instill 2 sprays into each nostril twice a day as directed 30 mL 0  . CAMILA 0.35 MG tablet Take 1 tablet by mouth daily.     . citalopram (CELEXA) 20 MG tablet Take 1 tablet (20 mg total) by mouth daily. 90 tablet 3  . metroNIDAZOLE (METROCREAM) 0.75 % cream Apply topically 2 (two) times daily. 45 g 2  . montelukast (SINGULAIR) 10 MG tablet Take 1 tablet (10 mg total) by mouth at bedtime. 30 tablet 11  . olopatadine (PATANOL) 0.1 % ophthalmic solution Place 1 drop into both eyes daily as needed.    Marland Kitchen PROAIR HFA 108 (90 BASE) MCG/ACT inhaler Use 2 puffs every 4 hours as needed 17 g 3  . propranolol (INDERAL) 10 MG tablet Take one by mouth as needed prior to public speaking 30 tablet 3   No facility-administered medications prior to visit.       EXAM:  There were no vitals taken for this visit.  There is no height or weight on file to calculate BMI.  GENERAL: vitals reviewed and listed above, alert, oriented, appears well hydrated and in no acute distress HEENT: atraumatic, conjunctiva  clear, no obvious abnormalities on inspection of external nose and ears OP : no lesion edema or exudate  NECK: no obvious masses on inspection palpation  LUNGS: clear to auscultation bilaterally, no wheezes, rales or rhonchi, good air movement CV: HRRR, no clubbing cyanosis or  peripheral edema nl cap refill  MS: moves all extremities without noticeable focal  abnormality PSYCH: pleasant and cooperative, no obvious depression or anxiety  ASSESSMENT AND PLAN:  Discussed the following assessment and plan:  No diagnosis found.  -Patient advised to return or notify health care team  if symptoms worsen ,persist or new concerns arise.  There are no Patient Instructions on file for this visit.   [name]. Tyjuan Demetro M.D.

## 2016-06-19 NOTE — Patient Instructions (Signed)
Take your vit d   This could be a stress fracture sometimes not seen on x ray right away.   Will let  You know results x ray and plan for fu  Limit weight bearing left foot in the interim .
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
## 2016-06-30 NOTE — Progress Notes (Signed)
[name] [address], [place] [id number] Phone: [phone] Subjective:    I'm seeing this patient by the request  of:  Eulas Post, MD Dr. Regis Bill  CC: Left foot pain  [id number]  Shelly Combs is a 50 y.o. female coming in with complaint of left foot pain. Patient has had pain for proximally 6 weeks. Was doing yoga she stretched out and had increased pain of the left foot. Pain seems to be intermittent but worse with weightbearing activity. Patient has no some mild swelling. Patient did have x-rays the left foot. Patient was found to have mild degenerative changes of the first MTP joint and some dorsal soft tissue swelling but otherwise unremarkable. Still has pain that seems to be intermittent. In certain shoes make differences. Sometimes feel like it is now associated with more of a numbness. Rates the severity of pain as 5 out of 10. Seems to be stable and if anything may be improving a small amount.  Also complaining of left shoulder pain. States that it is more a dull, throbbing aching pain. States his been going on months. No severe radiation down the leg, no numbness or tingling. Patient states that it can be uncomfortable at night. Denies any weakness. Rates the severity of pain as 5 out of 10. Certain activities such as reaching behind her back and be difficult.     Past Medical History:  Diagnosis Date  . Allergy   . Asthma   . Cardiac arrhythmia due to congenital heart disease   . Chronic back pain   . Uterine fibroid    Past Surgical History:  Procedure Laterality Date  . GANGLION CYST EXCISION  1980  . HYSTEROSCOPY  1999   uterine fibroids   Social History   Social History  . Marital status: Married    Spouse name: N/A  . Number of children: N/A  . Years of education: N/A   Social History Main Topics  . Smoking status: Never Smoker  . Smokeless tobacco: Not on file  . Alcohol use 1.2 oz/week    2 Glasses of  wine per week  . Drug use: No  . Sexual activity: Not on file   Other Topics Concern  . Not on file   Social History Narrative  . No narrative on file   No Known Allergies Family History  Problem Relation Age of Onset  . Arthritis Other   . Cancer Other     colon  . Heart disease Other   . Mental illness Other   . Cancer Paternal Grandfather     colon  . Atrial fibrillation Mother     Past medical history, social, surgical and family history all reviewed in electronic medical record.  No pertanent information unless stated regarding to the chief complaint.   Review of Systems: No headache, visual changes, nausea, vomiting, diarrhea, constipation, dizziness, abdominal pain, skin rash, fevers, chills, night sweats, weight loss, swollen lymph nodes, body aches, joint swelling, muscle aches, chest pain, shortness of breath, mood changes.   Objective  There were no vitals taken for this visit.  General: No apparent distress alert and oriented x3 mood and affect normal, dressed appropriately.  HEENT: Pupils equal, extraocular movements intact  Respiratory: Patient's speak in full sentences and does not appear short of breath  Cardiovascular: No lower extremity edema, non tender, no erythema  Skin: Warm dry intact with no signs of infection or rash on extremities or on  axial skeleton.  Abdomen: Soft nontender  Neuro: Cranial nerves II through XII are intact, neurovascularly intact in all extremities with 2+ DTRs and 2+ pulses.  Lymph: No lymphadenopathy of posterior or anterior cervical chain or axillae bilaterally.  Gait normal with good balance and coordination.  MSK:  Non tender with full range of motion and good stability and symmetric strength and tone of  elbows, wrist, hip, knee and ankles bilaterally.  Foot exam shows   Shoulder: left Inspection reveals no abnormalities, atrophy or asymmetry. Palpation is normal with no tenderness over AC joint or bicipital groove. ROM is  full in all planes passively. Rotator cuff strength normal throughout. signs of impingement with positive Neer and Hawkin's tests, but negative empty can sign. Speeds and Yergason's tests normal. No labral pathology noted with negative Obrien's, negative clunk and good stability. Normal scapular function observed. No painful arc and no drop arm sign. No apprehension sign  MSK US performed of: left This study was ordered, performed, and interpreted by Charlann Boxer D.O.  Shoulder:   Supraspinatus:  Appears normal on long and transverse views, Bursal bulge seen with shoulder abduction on impingement view. Infraspinatus:  Appears normal on long and transverse views. Significant increase in Doppler flow Subscapularis:  Appears normal on long and transverse views. Positive bursa Teres Minor:  Appears normal on long and transverse views. AC joint:  Capsule undistended, no geyser sign. Glenohumeral Joint:  Appears normal without effusion. Glenoid Labrum:  Intact without visualized tears. Biceps Tendon:  Appears normal on long and transverse views, no fraying of tendon, tendon located in intertubercular groove, no subluxation with shoulder internal or external rotation.  Impression: Subacromial bursitis  Procedure note [id number]; 15 minutes spent for Therapeutic exercises as stated in above notes.  This included exercises focusing on stretching, strengthening, with significant focus on eccentric aspects.  Shoulder Exercises that included:  Basic scapular stabilization to include adduction and depression of scapula Scaption, focusing on proper movement and good control Internal and External rotation utilizing a theraband, with elbow tucked at side entire time Rows with theraband    Proper technique shown and discussed handout in great detail with ATC.  All questions were discussed and answered.      Impression and Recommendations:     This case required medical decision making of moderate  complexity.      Note: This dictation was prepared with Dragon dictation along with smaller phrase technology. Any transcriptional errors that result from this process are unintentional.

## 2016-07-01 ENCOUNTER — Ambulatory Visit (INDEPENDENT_AMBULATORY_CARE_PROVIDER_SITE_OTHER): Payer: BC Managed Care – PPO | Admitting: Family Medicine

## 2016-07-01 ENCOUNTER — Encounter: Payer: Self-pay | Admitting: Family Medicine

## 2016-07-01 ENCOUNTER — Ambulatory Visit: Payer: Self-pay

## 2016-07-01 VITALS — BP 118/84 | HR 65 | Wt 139.0 lb

## 2016-07-01 DIAGNOSIS — M25512 Pain in left shoulder: Secondary | ICD-10-CM

## 2016-07-01 DIAGNOSIS — G5762 Lesion of plantar nerve, left lower limb: Secondary | ICD-10-CM

## 2016-07-01 DIAGNOSIS — M7552 Bursitis of left shoulder: Secondary | ICD-10-CM | POA: Insufficient documentation

## 2016-07-01 MED ORDER — DICLOFENAC SODIUM 2 % TD SOLN: TRANSDERMAL | 3 refills | Status: DC

## 2016-07-01 MED ORDER — VITAMIN D (ERGOCALCIFEROL) 1.25 MG (50000 UNIT) PO CAPS: 50000.0000 [IU] | ORAL_CAPSULE | ORAL | 0 refills | Status: DC

## 2016-07-01 NOTE — Patient Instructions (Addendum)
Good to see you  Ice 20 minutes 2 times daily. Usually after activity and before bed. [employer] orthotics "total support" online would be great  Good shoes with rigid bottom.  Jalene Mullet, Merrell or New balance greater then 700 pennsaid pinkie amount topically 2 times daily as needed.  Avoid being barefoot even in the house.    For the shoulder pennsaid pinkie amount topically 2 times daily as needed.  Once weekly vitamin D Turmeric 500mg  twice daily  Keep hands within peripheral vision.  I would do some more strength training and avoid some of the excessive stretching  See me again in 3-4 weeks and we will consider injections if not better.

## 2016-07-01 NOTE — Assessment & Plan Note (Signed)
Subacromial bursitis. Will start with topical anti-inflammatories, work with Product/process development scientist to learn home exercises in greater detail, we discussed icing pedicle. Follow-up again in 3-4 weeks. If forcing pain consider x-ray, injection, formal physical therapy.

## 2016-07-01 NOTE — Assessment & Plan Note (Signed)
Discussed proper shoes, topical anti-inflammatories, once weekly vitamin D, avoiding being barefoot. Icing protocol. Follow-up again in 4 weeks. Worsening symptoms consider injection

## 2016-07-21 NOTE — Progress Notes (Signed)
[name] [address] [place], [place] [address] Phone: [phone] Subjective:    I'm seeing this patient by the request  of:  Eulas Post, MD Dr. Regis Bill  CC: Left foot pain, left shoulder pain  [id number]  Shelly Combs is a 50 y.o. female coming in with complaint of left foot pain. atient was found to have a neuroma. Patient was to do over-the-counter orthotics, icing protocol, topical anti-inflammatories. Patient states significantly improved home exercises and proper shoes. Mild pain but nothing severe.  Also complaining of left shoulder pain. atient was found to havea subacromial bursitis. Patient states a little different than previously. Patient states that it is now going down the anterior aspect of the shoulder. Patient denies any weakness. States though that if she holds things in a certain angle she has more pain. More pain in the morning.     Past Medical History:  Diagnosis Date  . Allergy   . Asthma   . Cardiac arrhythmia due to congenital heart disease   . Chronic back pain   . Uterine fibroid    Past Surgical History:  Procedure Laterality Date  . GANGLION CYST EXCISION  1980  . HYSTEROSCOPY  1999   uterine fibroids   Social History   Social History  . Marital status: Married    Spouse name: N/A  . Number of children: N/A  . Years of education: N/A   Social History Main Topics  . Smoking status: Never Smoker  . Smokeless tobacco: None  . Alcohol use 1.2 oz/week    2 Glasses of wine per week  . Drug use: No  . Sexual activity: Not Asked   Other Topics Concern  . None   Social History Narrative  . None   No Known Allergies Family History  Problem Relation Age of Onset  . Arthritis Other   . Cancer Other     colon  . Heart disease Other   . Mental illness Other   . Cancer Paternal Grandfather     colon  . Atrial fibrillation Mother     Past medical history, social, surgical and family history  all reviewed in electronic medical record.  No pertanent information unless stated regarding to the chief complaint.   Review of Systems: No headache, visual changes, nausea, vomiting, diarrhea, constipation, dizziness, abdominal pain, skin rash, fevers, chills, night sweats, weight loss, swollen lymph nodes, body aches, joint swelling, muscle aches, chest pain, shortness of breath, mood changes.   Objective  Blood pressure 110/78, pulse 69, height 5\' 1"  (1.549 m), weight 140 lb (63.5 kg), SpO2 97 %.  Systems examined below as of 07/22/16 General: NAD A&O x3 mood, affect normal  HEENT: Pupils equal, extraocular movements intact no nystagmus Respiratory: not short of breath at rest or with speaking Cardiovascular: No lower extremity edema, non tender Skin: Warm dry intact with no signs of infection or rash on extremities or on axial skeleton. Abdomen: Soft nontender, no masses Neuro: Cranial nerves  intact, neurovascularly intact in all extremities with 2+ DTRs and 2+ pulses. Lymph: No lymphadenopathy appreciated today  Gait normal with good balance and coordination.  MSK: Non tender with full range of motion and good stability and symmetric strength and tone of shoulders, elbows, wrist,  knee hips and bilaterally.   Foot exam shows breakdown of transverse arch. Still positive squeeze test.   Shoulder: left Inspection reveals no abnormalities, atrophy or asymmetry. Palpation is normal with no tenderness over  AC joint or bicipital groove. ROM is full in all planes passively. Rotator cuff strength normal throughout. Mild continue impingement signs. Marland Kitchen Speeds and Yergason's tests POSITIVE. No labral pathology noted with negative Obrien's, negative clunk and good stability. Normal scapular function observed. No painful arc and no drop arm sign. No apprehension sign       Impression and Recommendations:     This case required medical decision making of moderate complexity.       Note: This dictation was prepared with Dragon dictation along with smaller phrase technology. Any transcriptional errors that result from this process are unintentional.

## 2016-07-22 ENCOUNTER — Ambulatory Visit (INDEPENDENT_AMBULATORY_CARE_PROVIDER_SITE_OTHER): Payer: BC Managed Care – PPO | Admitting: Family Medicine

## 2016-07-22 ENCOUNTER — Encounter: Payer: Self-pay | Admitting: Family Medicine

## 2016-07-22 VITALS — BP 110/78 | HR 69 | Ht 61.0 in | Wt 140.0 lb

## 2016-07-22 DIAGNOSIS — G5762 Lesion of plantar nerve, left lower limb: Secondary | ICD-10-CM

## 2016-07-22 DIAGNOSIS — M7522 Bicipital tendinitis, left shoulder: Secondary | ICD-10-CM | POA: Diagnosis not present

## 2016-07-22 DIAGNOSIS — M7552 Bursitis of left shoulder: Secondary | ICD-10-CM | POA: Diagnosis not present

## 2016-07-22 NOTE — Assessment & Plan Note (Signed)
New finding, sent to PT for this and bursitis.  Encouraged proper lifting, discussed pennsaid and compression  RTC in 4 weeks if not better consider injection.

## 2016-07-22 NOTE — Assessment & Plan Note (Signed)
Improving, continue the good shoes and OTC orthotics.  Continue the topical medicine and if not better we will consider injection or PT or custom orthotics.

## 2016-07-22 NOTE — Patient Instructions (Signed)
Good to see you  You are making progress.  Physical therapy will be calling you  Continue vitamin D Ice still is your friend  Compression sleeve at dicks can help the bicep tendon. Continue the pennsaid.  DHEA 50 mg daily for 1 month then as needed.  See me again in 6 weeks.

## 2016-07-22 NOTE — Assessment & Plan Note (Signed)
Improving slowly 

## 2016-07-25 ENCOUNTER — Other Ambulatory Visit: Payer: Self-pay | Admitting: Family Medicine

## 2016-08-19 ENCOUNTER — Ambulatory Visit (INDEPENDENT_AMBULATORY_CARE_PROVIDER_SITE_OTHER): Payer: BC Managed Care – PPO | Admitting: Family Medicine

## 2016-08-19 VITALS — BP 110/80 | HR 74 | Temp 98.2°F | Ht 61.0 in | Wt 142.0 lb

## 2016-08-19 DIAGNOSIS — B9789 Other viral agents as the cause of diseases classified elsewhere: Secondary | ICD-10-CM | POA: Diagnosis not present

## 2016-08-19 DIAGNOSIS — J069 Acute upper respiratory infection, unspecified: Secondary | ICD-10-CM

## 2016-08-19 MED ORDER — OLOPATADINE HCL 0.1 % OP SOLN: 1.0000 [drp] | Freq: Every day | OPHTHALMIC | 2 refills | Status: DC | PRN

## 2016-08-19 NOTE — Patient Instructions (Signed)
Follow up for any fever or worsening symptoms. 

## 2016-08-19 NOTE — Progress Notes (Signed)
Subjective:     Patient ID: Shelly Combs, female   DOB: Sep 19, 1965, 50 y.o.   MRN: [id number]  HPI Seen with 2 week history of cough, postnasal drainage, malaise. She states her symptoms actually been going on about 7 or 8 days. She had some low-grade fever 3 days ago but none since then. Denies any nausea, vomiting, or diarrhea. Minimal sore throat initially. Had some colored nasal discharge a few days ago but now clear. Patient is nonsmoker  Past Medical History:  Diagnosis Date  . Allergy   . Asthma   . Cardiac arrhythmia due to congenital heart disease   . Chronic back pain   . Uterine fibroid    Past Surgical History:  Procedure Laterality Date  . GANGLION CYST EXCISION  1980  . HYSTEROSCOPY  1999   uterine fibroids    reports that she has never smoked. She does not have any smokeless tobacco history on file. She reports that she drinks about 1.2 oz of alcohol per week . She reports that she does not use drugs. family history includes Arthritis in her other; Atrial fibrillation in her mother; Cancer in her other and paternal grandfather; Heart disease in her other; Mental illness in her other. No Known Allergies   Review of Systems  Constitutional: Positive for fatigue. Negative for chills and fever.  HENT: Positive for congestion and postnasal drip.   Respiratory: Positive for cough.        Objective:   Physical Exam  Constitutional: She appears well-developed and well-nourished.  HENT:  Right Ear: External ear normal.  Left Ear: External ear normal.  Mouth/Throat: Oropharynx is clear and moist.  Neck: Neck supple.  Cardiovascular: Normal rate and regular rhythm.   Pulmonary/Chest: Effort normal and breath sounds normal. No respiratory distress. She has no wheezes. She has no rales.  Lymphadenopathy:    She has no cervical adenopathy.       Assessment:     Probable viral URI with cough. Nonfocal exam    Plan:     -Stay well-hydrated and treat symptoms at this  point -No indication for antibiotics -Follow-up promptly for any fever or worsening/persistent symptoms  Eulas Post MD [facility]

## 2016-08-19 NOTE — Progress Notes (Signed)
Pre visit review using our clinic review tool, if applicable. No additional management support is needed unless otherwise documented below in the visit note. 

## 2016-08-27 ENCOUNTER — Ambulatory Visit (INDEPENDENT_AMBULATORY_CARE_PROVIDER_SITE_OTHER): Payer: BC Managed Care – PPO | Admitting: Family Medicine

## 2016-08-27 ENCOUNTER — Ambulatory Visit: Payer: Self-pay

## 2016-08-27 ENCOUNTER — Encounter: Payer: Self-pay | Admitting: Family Medicine

## 2016-08-27 VITALS — BP 106/76 | HR 70 | Ht 61.0 in | Wt 142.6 lb

## 2016-08-27 DIAGNOSIS — M7522 Bicipital tendinitis, left shoulder: Secondary | ICD-10-CM

## 2016-08-27 DIAGNOSIS — M25512 Pain in left shoulder: Secondary | ICD-10-CM | POA: Diagnosis not present

## 2016-08-27 NOTE — Patient Instructions (Signed)
God to see you  We injected the bicep tendon Please get a compression sleeve for the arm, it will help Go to PT as long as you think it is helping.  Try to keep hands within peripheral vision.  See me again in 4 weeks Happy holidays!

## 2016-08-27 NOTE — Progress Notes (Signed)
[name] [address] [place], [place] [address] Phone: [phone] Subjective:    I'm seeing this patient by the request  of:  Eulas Post, MD Dr. Regis Bill  CC: , left shoulder pain Follow-up  [id number]  Shelly Combs is a 50 y.o. female coming in with complaint of left foot pain. atient was found to have a neuroma. Patient was to do over-the-counter orthotics, icing protocol, topical anti-inflammatories. Patient states significantly improved home exercises and proper shoes. M no pain at this time.  Also complaining of left shoulder pain. atient was found to havea subacromial bursitis. Patient was also found to have more of a bicep tendinitis. Has been going to formal physical therapy. Is about 85% improved. Still though unfortunately has a sharp pain that keeps her from certain movements. Patient states that this seems to be intermittent and possibly increasing in frequency.     Past Medical History:  Diagnosis Date  . Allergy   . Asthma   . Cardiac arrhythmia due to congenital heart disease   . Chronic back pain   . Uterine fibroid    Past Surgical History:  Procedure Laterality Date  . GANGLION CYST EXCISION  1980  . HYSTEROSCOPY  1999   uterine fibroids   Social History   Social History  . Marital status: Married    Spouse name: N/A  . Number of children: N/A  . Years of education: N/A   Social History Main Topics  . Smoking status: Never Smoker  . Smokeless tobacco: None  . Alcohol use 1.2 oz/week    2 Glasses of wine per week  . Drug use: No  . Sexual activity: Not Asked   Other Topics Concern  . None   Social History Narrative  . None   No Known Allergies Family History  Problem Relation Age of Onset  . Arthritis Other   . Cancer Other     colon  . Heart disease Other   . Mental illness Other   . Cancer Paternal Grandfather     colon  . Atrial fibrillation Mother     Past medical history, social,  surgical and family history all reviewed in electronic medical record.  No pertanent information unless stated regarding to the chief complaint.   Review of Systems: No headache, visual changes, nausea, vomiting, diarrhea, constipation, dizziness, abdominal pain, skin rash, fevers, chills, night sweats, weight loss, swollen lymph nodes, body aches, joint swelling, muscle aches, chest pain, shortness of breath, mood changes.  .   Objective  Blood pressure 106/76, pulse 70, height 5\' 1"  (1.549 m), weight 142 lb 9.6 oz (64.7 kg).  Systems examined below as of 08/27/16 General: NAD A&O x3 mood, affect normal  HEENT: Pupils equal, extraocular movements intact no nystagmus Respiratory: not short of breath at rest or with speaking Cardiovascular: No lower extremity edema, non tender Skin: Warm dry intact with no signs of infection or rash on extremities or on axial skeleton. Abdomen: Soft nontender, no masses Neuro: Cranial nerves  intact, neurovascularly intact in all extremities with 2+ DTRs and 2+ pulses. Lymph: No lymphadenopathy appreciated today  Gait normal with good balance and coordination.  MSK: Non tender with full range of motion and good stability and symmetric strength and tone of elbows, wrist,  knee hips and ankles bilaterally.     Shoulder: left Inspection reveals no abnormalities, atrophy or asymmetry. Palpation is normal with no tenderness over AC joint or bicipital groove. ROM is  full in all planes passively. Rotator cuff strength normal throughout. Very minimal impingement sign still remaining Speeds and Yergason's tests POSITIVE. No labral pathology noted with negative Obrien's, negative clunk and good stability. Normal scapular function observed. No painful arc and no drop arm sign. No apprehension sign  Procedure: Real-time Ultrasound Guided Injection of left bicep tendon sheath Device: GE Logiq E  Ultrasound guided injection is preferred based studies that show  increased duration, increased effect, greater accuracy, decreased procedural pain, increased response rate, and decreased cost with ultrasound guided versus blind injection.  Verbal informed consent obtained.  Time-out conducted.  Noted no overlying erythema, induration, or other signs of local infection.  Skin prepped in a sterile fashion.  Local anesthesia: Topical Ethyl chloride.  With sterile technique and under real time ultrasound guidance:  With a 25-gauge half-inch needle patient was injected with a total of 0.5 mL of 0.5% Marcaine and 0.5 mL of Kenalog 40 mg/dL Completed without difficulty  Pain immediately resolved suggesting accurate placement of the medication.  Advised to call if fevers/chills, erythema, induration, drainage, or persistent bleeding.  Images permanently stored and available for review in the ultrasound unit.  Impression: Technically successful ultrasound guided injection.     Impression and Recommendations:     This case required medical decision making of moderate complexity.      Note: This dictation was prepared with Dragon dictation along with smaller phrase technology. Any transcriptional errors that result from this process are unintentional.

## 2016-08-27 NOTE — Assessment & Plan Note (Signed)
Patient was having no significant improvement from previous exam. Was given an injection. Encourage her to continue to work with formal physical therapy. We discussed which lifting mechanics. Follow-up again in 4 weeks. If no significant improvement advance imaging would be warranted

## 2016-09-26 NOTE — Progress Notes (Signed)
[facility] [address], [place] [address] Phone: [phone] Subjective:    I'm seeing this patient by the request  of:  Shelly Post, MD Dr. Regis Bill  CC: , left shoulder pain Follow-up  [id number]  Shelly Combs is a 51 y.o. female coming in with   Also complaining of left shoulder pain. atient was found to havea subacromial bursitis. Patient was 85% better with formal physical therapy and elected to have a left bicep tendon sheath injection given 08/31/2016. Patient was to continue conservative therapy otherwise. Patient states doing very well overall. Mild discomfort on the certain movements as well as at night but nothing severe. No radiation down the arm. No numbness or tingling. Patient states that overall she is doing approximately 90-95% better. Was doing better though when she was doing the exercises regularly but because of the holidays has discontinued that as much.     Past Medical History:  Diagnosis Date  . Allergy   . Asthma   . Cardiac arrhythmia due to congenital heart disease   . Chronic back pain   . Uterine fibroid    Past Surgical History:  Procedure Laterality Date  . GANGLION CYST EXCISION  1980  . HYSTEROSCOPY  1999   uterine fibroids   Social History   Social History  . Marital status: Married    Spouse name: N/A  . Number of children: N/A  . Years of education: N/A   Social History Main Topics  . Smoking status: Never Smoker  . Smokeless tobacco: Never Used  . Alcohol use 1.2 oz/week    2 Glasses of wine per week  . Drug use: No  . Sexual activity: Not Asked   Other Topics Concern  . None   Social History Narrative  . None   No Known Allergies Family History  Problem Relation Age of Onset  . Arthritis Other   . Cancer Other     colon  . Heart disease Other   . Mental illness Other   . Cancer Paternal Grandfather     colon  . Atrial fibrillation Mother     Past medical history,  social, surgical and family history all reviewed in electronic medical record.  No pertanent information unless stated regarding to the chief complaint.   Review of Systems: No  visual changes, nausea, vomiting, diarrhea, constipation, dizziness, abdominal pain, skin rash, fevers, chills, night sweats, weight loss, swollen lymph nodes, body aches, joint swelling, muscle aches, chest pain, shortness of breath, mood changes.  Jennifer infrequent headaches. .   Objective  Blood pressure 112/78, pulse 66, height 5\' 1"  (1.549 m), weight 137 lb (62.1 kg), SpO2 97 %.  Systems examined below as of 09/29/16 General: NAD A&O x3 mood, affect normal  HEENT: Pupils equal, extraocular movements intact no nystagmus Respiratory: not short of breath at rest or with speaking Cardiovascular: No lower extremity edema, non tender Skin: Warm dry intact with no signs of infection or rash on extremities or on axial skeleton. Abdomen: Soft nontender, no masses Neuro: Cranial nerves  intact, neurovascularly intact in all extremities with 2+ DTRs and 2+ pulses. Lymph: No lymphadenopathy appreciated today  Gait normal with good balance and coordination.  MSK: Non tender with full range of motion and good stability and symmetric strength and tone of elbows, wrist,  knee hips and ankles bilaterally.     Shoulder: left Inspection reveals no abnormalities, atrophy or asymmetry. Palpation is normal with no tenderness  over [facility] joint or bicipital groove. ROM is full in all planes passively. Rotator cuff strength normal throughout. Negative impingement Speeds and Yergason's tests Mildly positive still. No labral pathology noted with negative Obrien's, negative clunk and good stability. Normal scapular function observed. No painful arc and no drop arm sign. No apprehension sign     Impression and Recommendations:     This case required medical decision making of moderate complexity.      Note: This dictation  was prepared with Dragon dictation along with smaller phrase technology. Any transcriptional errors that result from this process are unintentional.

## 2016-09-29 ENCOUNTER — Encounter: Payer: Self-pay | Admitting: Family Medicine

## 2016-09-29 ENCOUNTER — Ambulatory Visit (INDEPENDENT_AMBULATORY_CARE_PROVIDER_SITE_OTHER): Payer: BC Managed Care – PPO | Admitting: Family Medicine

## 2016-09-29 DIAGNOSIS — M7522 Bicipital tendinitis, left shoulder: Secondary | ICD-10-CM

## 2016-09-29 MED ORDER — VITAMIN D (ERGOCALCIFEROL) 1.25 MG (50000 UNIT) PO CAPS: 50000.0000 [IU] | ORAL_CAPSULE | ORAL | 0 refills | Status: DC

## 2016-09-29 NOTE — Patient Instructions (Addendum)
Good to see you  Ice is still a good idea when needed.  Exercises 2-3 times a week.  Compression sleeve with activity  COntoinue the vitamin D  Work on the sleep position a little bit Get back into the routine.  See me again in 6-8 weeks if not better.

## 2016-09-29 NOTE — Assessment & Plan Note (Signed)
Seems to be doing relatively well at this time. Encourage her to continue the conservative therapy including home exercises on a regular basis. Started increasing her activity as tolerated. His lungs patient as well as she'll follow-up as needed. Worsening symptoms we'll consider advanced imaging or repeating injection depending on how long patient's symptoms resolve for

## 2017-01-16 ENCOUNTER — Other Ambulatory Visit: Payer: Self-pay | Admitting: Family Medicine

## 2017-05-28 ENCOUNTER — Encounter: Payer: Self-pay | Admitting: Family Medicine

## 2017-07-27 ENCOUNTER — Other Ambulatory Visit: Payer: Self-pay | Admitting: Family Medicine

## 2017-10-30 ENCOUNTER — Other Ambulatory Visit: Payer: Self-pay | Admitting: Family Medicine

## 2017-10-30 ENCOUNTER — Encounter: Payer: Self-pay | Admitting: Obstetrics & Gynecology

## 2017-10-30 ENCOUNTER — Ambulatory Visit: Payer: BC Managed Care – PPO | Admitting: Obstetrics & Gynecology

## 2017-10-30 VITALS — BP 132/84

## 2017-10-30 DIAGNOSIS — R4586 Emotional lability: Secondary | ICD-10-CM

## 2017-10-30 DIAGNOSIS — N951 Menopausal and female climacteric states: Secondary | ICD-10-CM | POA: Diagnosis not present

## 2017-10-30 MED ORDER — NORETHINDRONE 0.35 MG PO TABS: 1.0000 | ORAL_TABLET | Freq: Every day | ORAL | 4 refills | Status: DC

## 2017-10-30 NOTE — Progress Notes (Signed)
    Shelly Combs Jan 22, 1966 [id number]        52 y.o.  G0 married.  Patient is a Warden/ranger at [employer].  1 adopted son 103 yo.  Long history of primary infertility.  RP: Oligomenorrhea with changes in mood  HPI: Oligomenorrhea with LMP 08/2017.  Previously had menses every month, normal flow.  No hot flushes/night sweats.  Feels constantly in PMS.  No major depressive symptoms and no suicidal ideation on Celexa.  But often has low moods and feels tired.  Tends to eat more sugar when does not feel well and is gaining weight.  Patient felt better when she was on the progestin only pill, but stopped it a year ago.  No pelvic pain currently.  Normal vaginal secretions.  No pain with intercourse.   OB History  No data available    Past medical history,surgical history, problem list, medications, allergies, family history and social history were all reviewed and documented in the EPIC chart.   Directed ROS with pertinent positives and negatives documented in the history of present illness/assessment and plan.  Exam:  Vitals:   10/30/17 1428  BP: 132/84   General appearance:  Normal  GYN exam deferred   Assessment/Plan:  52 y.o. No obstetric history on file.   1. Perimenopause Recent oligomenorrhea probably associated with perimenopause.  Will check FSH and TSH today.  Will start on the progestin only pill to protect the endometrium and hopefully stabilize her hormones and symptoms.  Usage, risks and benefits of the progestin only pill reviewed. - FSH - TSH  2. Mood change Depressive symptoms controlled on Celexa 20 mg daily.  Recent mood change may be associated with perimenopause, but will rule out thyroid dysfunction.  Will try to control hormonal fluctuations with the progestin only pill.  Recommend increased aerobic activities to 5 times a week. - TSH  Other orders - norethindrone (MICRONOR,CAMILA,ERRIN) 0.35 MG tablet; Take 1 tablet (0.35 mg total) by mouth daily.  Counseling on  above issues more than 50% for 25 minutes.  Princess Bruins MD, 2:39 PM 10/30/2017

## 2017-10-30 NOTE — Patient Instructions (Signed)
1. Perimenopause Recent oligomenorrhea probably associated with perimenopause.  Will check FSH and TSH today.  Will start on the progestin only pill to protect the endometrium and hopefully stabilize her hormones and symptoms.  Usage, risks and benefits of the progestin only pill reviewed. - FSH - TSH  2. Mood change Depressive symptoms controlled on Celexa 20 mg daily.  Recent mood change may be associated with perimenopause, but will rule out thyroid dysfunction.  Will try to control hormonal fluctuations with the progestin only pill.  Recommend increased aerobic activities to 5 times a week. - TSH  Other orders - norethindrone (MICRONOR,CAMILA,ERRIN) 0.35 MG tablet; Take 1 tablet (0.35 mg total) by mouth daily.  Leasa, it was a pleasure seeing you today!  I will inform you of your results as soon as they are available.  See you again soon for your annual gynecologic exam.  Perimenopause Perimenopause is the time when your body begins to move into the menopause (no menstrual period for 12 straight months). It is a natural process. Perimenopause can begin 2-8 years before the menopause and usually lasts for 1 year after the menopause. During this time, your ovaries may or may not produce an egg. The ovaries vary in their production of estrogen and progesterone hormones each month. This can cause irregular menstrual periods, difficulty getting pregnant, vaginal bleeding between periods, and uncomfortable symptoms. What are the causes?  Irregular production of the ovarian hormones, estrogen and progesterone, and not ovulating every month. Other causes include:  Tumor of the pituitary gland in the brain.  Medical disease that affects the ovaries.  Radiation treatment.  Chemotherapy.  Unknown causes.  Heavy smoking and excessive alcohol intake can bring on perimenopause sooner.  What are the signs or symptoms?  Hot flashes.  Night sweats.  Irregular menstrual periods.  Decreased  sex drive.  Vaginal dryness.  Headaches.  Mood swings.  Depression.  Memory problems.  Irritability.  Tiredness.  Weight gain.  Trouble getting pregnant.  The beginning of losing bone cells (osteoporosis).  The beginning of hardening of the arteries (atherosclerosis). How is this diagnosed? Your health care provider will make a diagnosis by analyzing your age, menstrual history, and symptoms. He or she will do a physical exam and note any changes in your body, especially your female organs. Female hormone tests may or may not be helpful depending on the amount of female hormones you produce and when you produce them. However, other hormone tests may be helpful to rule out other problems. How is this treated? In some cases, no treatment is needed. The decision on whether treatment is necessary during the perimenopause should be made by you and your health care provider based on how the symptoms are affecting you and your lifestyle. Various treatments are available, such as:  Treating individual symptoms with a specific medicine for that symptom.  Herbal medicines that can help specific symptoms.  Counseling.  Group therapy.  Follow these instructions at home:  Keep track of your menstrual periods (when they occur, how heavy they are, how long between periods, and how long they last) as well as your symptoms and when they started.  Only take over-the-counter or prescription medicines as directed by your health care provider.  Sleep and rest.  Exercise.  Eat a diet that contains calcium (good for your bones) and soy (acts like the estrogen hormone).  Do not smoke.  Avoid alcoholic beverages.  Take vitamin supplements as recommended by your health care provider. Taking vitamin  E may help in certain cases.  Take calcium and vitamin D supplements to help prevent bone loss.  Group therapy is sometimes helpful.  Acupuncture may help in some cases. Contact a health  care provider if:  You have questions about any symptoms you are having.  You need a referral to a specialist (gynecologist, psychiatrist, or psychologist). Get help right away if:  You have vaginal bleeding.  Your period lasts longer than 8 days.  Your periods are recurring sooner than 21 days.  You have bleeding after intercourse.  You have severe depression.  You have pain when you urinate.  You have severe headaches.  You have vision problems. This information is not intended to replace advice given to you by your health care provider. Make sure you discuss any questions you have with your health care provider. Document Released: 10/02/2004 Document Revised: 01/31/2016 Document Reviewed: 03/24/2013 Elsevier Interactive Patient Education  2017 [employer].

## 2017-10-31 LAB — FOLLICLE STIMULATING HORMONE: FSH: 23.1 m[IU]/mL

## 2017-10-31 LAB — TSH: TSH: 3 mIU/L

## 2017-11-02 ENCOUNTER — Encounter: Payer: Self-pay | Admitting: Family Medicine

## 2017-11-02 ENCOUNTER — Ambulatory Visit: Payer: BC Managed Care – PPO | Admitting: Family Medicine

## 2017-11-02 VITALS — BP 110/70 | HR 65 | Temp 98.3°F | Wt 147.1 lb

## 2017-11-02 DIAGNOSIS — J301 Allergic rhinitis due to pollen: Secondary | ICD-10-CM

## 2017-11-02 DIAGNOSIS — J4530 Mild persistent asthma, uncomplicated: Secondary | ICD-10-CM

## 2017-11-02 DIAGNOSIS — F419 Anxiety disorder, unspecified: Secondary | ICD-10-CM | POA: Diagnosis not present

## 2017-11-02 MED ORDER — CITALOPRAM HYDROBROMIDE 20 MG PO TABS: 20.0000 mg | ORAL_TABLET | Freq: Every day | ORAL | 3 refills | Status: DC

## 2017-11-02 MED ORDER — BECLOMETHASONE DIPROP HFA 80 MCG/ACT IN AERB: 1.0000 | INHALATION_SPRAY | Freq: Two times a day (BID) | RESPIRATORY_TRACT | 3 refills | Status: DC

## 2017-11-02 MED ORDER — MONTELUKAST SODIUM 10 MG PO TABS: 10.0000 mg | ORAL_TABLET | Freq: Every day | ORAL | 3 refills | Status: DC

## 2017-11-02 NOTE — Progress Notes (Signed)
Subjective:     Patient ID: Shelly Combs, female   DOB: Mar 24, 1966, 52 y.o.   MRN: [id number]  HPI Patient seen for medical follow-up requesting refills. She has history of some chronic anxiety and depression and is on Celexa 20 mg daily. She's had recent stress of father diagnosed with Alzheimer's disease age 42. 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  She has history of seasonal and perennial allergies mostly fall and spring. She is on Singulair 10 mg daily which seems to work well. She has used steroid inhaler for asthma in the past and tends to uses seasonally in the spring and fall. She has been on Qvar 80 one puff twice daily. Requesting refills  Past Medical History:  Diagnosis Date  . Allergy   . Asthma   . Cardiac arrhythmia due to congenital heart disease   . Chronic back pain   . Uterine fibroid    Past Surgical History:  Procedure Laterality Date  . GANGLION CYST EXCISION  1980  . HYSTEROSCOPY  1999   uterine fibroids    reports that  has never smoked. she has never used smokeless tobacco. She reports that she drinks about 1.2 oz of alcohol per week. She reports that she does not use drugs. family history includes Arthritis in her other; Atrial fibrillation in her mother; Cancer in her other and paternal grandfather; Heart disease in her other; Mental illness in her other. No Known Allergies    Review of Systems  Constitutional: Negative for fatigue.  HENT: Negative for congestion and sore throat.   Eyes: Negative for visual disturbance.  Respiratory: Negative for cough, chest tightness and shortness of breath.   Cardiovascular: Negative for chest pain, palpitations and leg swelling.  Neurological: Negative for dizziness, seizures, syncope, weakness, light-headedness and headaches.  Psychiatric/Behavioral: Negative for agitation and suicidal ideas.       Objective:   Physical Exam  Constitutional: She appears well-developed and well-nourished.  Eyes: Pupils are equal,  round, and reactive to light.  Neck: Neck supple. No JVD present. No thyromegaly present.  Cardiovascular: Normal rate and regular rhythm. Exam reveals no gallop.  Pulmonary/Chest: Effort normal and breath sounds normal. No respiratory distress. She has no wheezes. She has no rales.  Musculoskeletal: She exhibits no edema.  Neurological: She is alert.       Assessment:     #1 seasonal allergies-stable on Singulair  #2 history of asthma which is mostly mild intermittent but tends to worsen during seasons with allergies such as spring and fall  #3 chronic anxiety and depression currently stable on Celexa 20 mg daily    Plan:     -Patient has questions regarding counseling. She's had some life stressors and would like to consider this. She was given brochure with some names to consider setting up -Refilled Qvar and Singulair for one year -Refill Celexa for one year -She will continue with yearly GYN follow-up  Eulas Post MD [facility] at [facility]

## 2018-01-07 ENCOUNTER — Encounter: Payer: Self-pay | Admitting: Obstetrics & Gynecology

## 2018-01-07 ENCOUNTER — Ambulatory Visit: Payer: BC Managed Care – PPO | Admitting: Obstetrics & Gynecology

## 2018-01-07 VITALS — BP 126/78 | Ht 61.75 in | Wt 141.6 lb

## 2018-01-07 DIAGNOSIS — N951 Menopausal and female climacteric states: Secondary | ICD-10-CM | POA: Diagnosis not present

## 2018-01-07 DIAGNOSIS — Z01419 Encounter for gynecological examination (general) (routine) without abnormal findings: Secondary | ICD-10-CM | POA: Diagnosis not present

## 2018-01-07 MED ORDER — NORETHINDRONE 0.35 MG PO TABS: 1.0000 | ORAL_TABLET | Freq: Every day | ORAL | 4 refills | Status: DC

## 2018-01-07 NOTE — Progress Notes (Signed)
Shelly Combs Jul 15, 1966 [id number]   History:    52 y.o. G0 Married.  Adopted son is 27 yo.  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  RP:  Established patient presenting for annual gyn exam   HPI: Well on the Progestin-only pill x 10/2017.  Had normal menses x 2 since then.  No pelvic pain. Normal vaginal secretions.  No pain with IC.  Breasts normal.  Urine/BMs wnl.  BMI 26.11. Fam MD Dr Elease Hashimoto.   Past medical history,surgical history, family history and social history were all reviewed and documented in the EPIC chart.  Gynecologic History Patient's last menstrual period was 12/31/2017. Contraception: oral progesterone-only contraceptive Last Pap: 10/2016. Results were: Negative/HPV HR neg Last mammogram: 2018. Results were: Negative Bone Density: Never Colonoscopy: Never  Obstetric History OB History  Gravida Para Term Preterm AB Living  0 0 0 0 0 0  SAB TAB Ectopic Multiple Live Births  0 0 0 0 0     ROS: A ROS was performed and pertinent positives and negatives are included in the history.  GENERAL: No fevers or chills. HEENT: No change in vision, no earache, sore throat or sinus congestion. NECK: No pain or stiffness. CARDIOVASCULAR: No chest pain or pressure. No palpitations. PULMONARY: No shortness of breath, cough or wheeze. GASTROINTESTINAL: No abdominal pain, nausea, vomiting or diarrhea, melena or bright red blood per rectum. GENITOURINARY: No urinary frequency, urgency, hesitancy or dysuria. MUSCULOSKELETAL: No joint or muscle pain, no back pain, no recent trauma. DERMATOLOGIC: No rash, no itching, no lesions. ENDOCRINE: No polyuria, polydipsia, no heat or cold intolerance. No recent change in weight. HEMATOLOGICAL: No anemia or easy bruising or bleeding. NEUROLOGIC: No headache, seizures, numbness, tingling or weakness. PSYCHIATRIC: No depression, no loss of interest in normal activity or change in sleep pattern.     Exam:   BP 126/78   Ht 5' 1.75" (1.568 m)   Wt 141 lb  9.6 oz (64.2 kg)   LMP 12/31/2017   BMI 26.11 kg/m   Body mass index is 26.11 kg/m.  General appearance : Well developed well nourished female. No acute distress HEENT: Eyes: no retinal hemorrhage or exudates,  Neck supple, trachea midline, no carotid bruits, no thyroidmegaly Lungs: Clear to auscultation, no rhonchi or wheezes, or rib retractions  Heart: Regular rate and rhythm, no murmurs or gallops Breast:Examined in sitting and supine position were symmetrical in appearance, no palpable masses or tenderness,  no skin retraction, no nipple inversion, no nipple discharge, no skin discoloration, no axillary or supraclavicular lymphadenopathy Abdomen: no palpable masses or tenderness, no rebound or guarding Extremities: no edema or skin discoloration or tenderness  Pelvic: Vulva: Normal             Vagina: No gross lesions or discharge  Cervix: No gross lesions or discharge  Uterus  AV, normal size, shape and consistency, non-tender and mobile  Adnexa  Without masses or tenderness  Anus: Normal   Assessment/Plan:  52 y.o. female for annual exam   1. Well female exam with routine gynecological exam Normal gynecologic exam.  Last Pap test was negative with negative high risk HPV on February 2018.  Will repeat Pap test next year.  Breast exam normal.  Will schedule screening mammogram when due this year.  Recommend a screening colonoscopy now is if not done in the last 2 years.  Health labs with family physician.  2. Perimenopause Having normal menses since started on the progestin only pill.  Will continue on it  until reaches full menopause.  Progestin pill represcribed.  Other orders - norethindrone (MICRONOR,CAMILA,ERRIN) 0.35 MG tablet; Take 1 tablet (0.35 mg total) by mouth daily.  Princess Bruins MD, 11:23 AM 01/07/2018

## 2018-01-10 ENCOUNTER — Encounter: Payer: Self-pay | Admitting: Obstetrics & Gynecology

## 2018-01-10 NOTE — Patient Instructions (Signed)
1. Well female exam with routine gynecological exam Normal gynecologic exam.  Last Pap test was negative with negative high risk HPV on February 2018.  Will repeat Pap test next year.  Breast exam normal.  Will schedule screening mammogram when due this year.  Recommend a screening colonoscopy now is if not done in the last 2 years.  Health labs with family physician.  2. Perimenopause Having normal menses since started on the progestin only pill.  Will continue on it until reaches full menopause.  Progestin pill represcribed.  Other orders - norethindrone (MICRONOR,CAMILA,ERRIN) 0.35 MG tablet; Take 1 tablet (0.35 mg total) by mouth daily.  [name], it was a pleasure seeing you today!

## 2018-03-04 ENCOUNTER — Encounter: Payer: Self-pay | Admitting: Internal Medicine

## 2018-03-04 ENCOUNTER — Ambulatory Visit: Payer: BC Managed Care – PPO | Admitting: Internal Medicine

## 2018-03-04 VITALS — BP 106/70 | HR 97 | Temp 98.4°F | Ht 61.75 in | Wt 141.0 lb

## 2018-03-04 DIAGNOSIS — H6983 Other specified disorders of Eustachian tube, bilateral: Secondary | ICD-10-CM

## 2018-03-04 DIAGNOSIS — J4530 Mild persistent asthma, uncomplicated: Secondary | ICD-10-CM

## 2018-03-04 DIAGNOSIS — J069 Acute upper respiratory infection, unspecified: Secondary | ICD-10-CM | POA: Diagnosis not present

## 2018-03-04 MED ORDER — DOXYCYCLINE HYCLATE 100 MG PO TABS: 100.0000 mg | ORAL_TABLET | Freq: Two times a day (BID) | ORAL | 0 refills | Status: DC

## 2018-03-04 NOTE — Assessment & Plan Note (Signed)
Mild to mod, for antibx course,  to f/u any worsening symptoms or concerns 

## 2018-03-04 NOTE — Progress Notes (Signed)
Subjective:    Patient ID: Shelly Combs, female    DOB: May 02, 1966, 52 y.o.   MRN: [id number]  HPI   Here with 2-3 days acute onset fever, facial pain, pressure, headache, general weakness and malaise, and greenish d/c, with mild ST and cough, but pt denies chest pain, wheezing, increased sob or doe, orthopnea, PND, increased LE swelling, palpitations, dizziness or syncope, but with worsening left ear pressure and fullness.  Pt denies new neurological symptoms such as new headache, or facial or extremity weakness or numbness   Pt denies polydipsia, polyuria Past Medical History:  Diagnosis Date  . Allergy   . Asthma   . Cardiac arrhythmia due to congenital heart disease   . Chronic back pain   . Uterine fibroid    Past Surgical History:  Procedure Laterality Date  . GANGLION CYST EXCISION  1980  . HYSTEROSCOPY  1999   uterine fibroids    reports that she has never smoked. She has never used smokeless tobacco. She reports that she drinks about 1.2 oz of alcohol per week. She reports that she does not use drugs. family history includes Arthritis in her other; Atrial fibrillation in her mother; Cancer in her other and paternal grandfather; Heart disease in her other; Mental illness in her other. No Known Allergies Current Outpatient Medications on File Prior to Visit  Medication Sig Dispense Refill  . ALPRAZolam (XANAX) 0.25 MG tablet take 1 tablet by mouth as directed FOR FLYING 30 tablet 0  . azelastine (ASTELIN) 0.1 % nasal spray instill 2 sprays into each nostril twice a day as directed 30 mL 0  . beclomethasone (QVAR REDIHALER) 80 MCG/ACT inhaler Inhale 1 puff into the lungs 2 (two) times daily. 3 Inhaler 3  . citalopram (CELEXA) 20 MG tablet Take 1 tablet (20 mg total) by mouth daily. 90 tablet 3  . montelukast (SINGULAIR) 10 MG tablet Take 1 tablet (10 mg total) by mouth at bedtime. 90 tablet 3  . norethindrone (MICRONOR,CAMILA,ERRIN) 0.35 MG tablet Take 1 tablet (0.35 mg total) by  mouth daily. 3 Package 4  . olopatadine (PATANOL) 0.1 % ophthalmic solution Place 1 drop into both eyes daily as needed. 5 mL 2  . PROAIR HFA 108 (90 BASE) MCG/ACT inhaler Use 2 puffs every 4 hours as needed 17 g 3   No current facility-administered medications on file prior to visit.    Review of Systems  Constitutional: Negative for other unusual diaphoresis or sweats HENT: Negative for ear discharge or swelling Eyes: Negative for other worsening visual disturbances Respiratory: Negative for stridor or other swelling  Gastrointestinal: Negative for worsening distension or other blood Genitourinary: Negative for retention or other urinary change Musculoskeletal: Negative for other MSK pain or swelling Skin: Negative for color change or other new lesions Neurological: Negative for worsening tremors and other numbness  Psychiatric/Behavioral: Negative for worsening agitation or other fatigue All other system neg per pt    Objective:   Physical Exam BP 106/70   Pulse 97   Temp 98.4 F (36.9 C) (Oral)   Ht 5' 1.75" (1.568 m)   Wt 141 lb (64 kg)   SpO2 98%   BMI 26.00 kg/m  VS noted,  Constitutional: Pt appears in NAD HENT: Head: NCAT.  Right Ear: External ear normal.  Left Ear: External ear normal.  Eyes: . Pupils are equal, round, and reactive to light. Conjunctivae and EOM are normal Nose: without d/c or deformity Neck: Neck supple. Gross normal ROM  Cardiovascular: Normal rate and regular rhythm.   Pulmonary/Chest: Effort normal and breath sounds without rales or wheezing.  Neurological: Pt is alert. At baseline orientation, motor grossly intact Skin: Skin is warm. No rashes, other new lesions, no LE edema Psychiatric: Pt behavior is normal without agitation  No other exam findings       Assessment & Plan:

## 2018-03-04 NOTE — Assessment & Plan Note (Signed)
stable overall by history and exam, and pt to continue medical treatment as before,  to f/u any worsening symptoms or concerns 

## 2018-03-04 NOTE — Assessment & Plan Note (Signed)
Ok for mucinex otc prn,  to f/u any worsening symptoms or concerns 

## 2018-03-04 NOTE — Patient Instructions (Signed)
Please take all new medication as prescribed - the antibiotic  You can also take Mucinex (or it's generic off brand) for congestion, and tylenol as needed for pain.  Please continue all other medications as before, and refills have been done if requested.  Please have the pharmacy call with any other refills you may need.  Please keep your appointments with your specialists as you may have planned   

## 2018-08-30 ENCOUNTER — Encounter: Payer: Self-pay | Admitting: Family Medicine

## 2018-08-30 ENCOUNTER — Other Ambulatory Visit: Payer: Self-pay

## 2018-08-30 ENCOUNTER — Ambulatory Visit: Payer: BC Managed Care – PPO | Admitting: Family Medicine

## 2018-08-30 VITALS — BP 110/78 | HR 79 | Temp 98.2°F | Ht 61.75 in | Wt 140.6 lb

## 2018-08-30 DIAGNOSIS — H1010 Acute atopic conjunctivitis, unspecified eye: Secondary | ICD-10-CM | POA: Diagnosis not present

## 2018-08-30 DIAGNOSIS — J301 Allergic rhinitis due to pollen: Secondary | ICD-10-CM

## 2018-08-30 DIAGNOSIS — R5383 Other fatigue: Secondary | ICD-10-CM

## 2018-08-30 DIAGNOSIS — M722 Plantar fascial fibromatosis: Secondary | ICD-10-CM

## 2018-08-30 MED ORDER — OLOPATADINE HCL 0.1 % OP SOLN: 1.0000 [drp] | Freq: Every day | OPHTHALMIC | 2 refills | Status: DC | PRN

## 2018-08-30 MED ORDER — VITAMIN D (ERGOCALCIFEROL) 1.25 MG (50000 UNIT) PO CAPS: 50000.0000 [IU] | ORAL_CAPSULE | ORAL | 1 refills | Status: DC

## 2018-08-30 NOTE — Patient Instructions (Signed)

## 2018-08-30 NOTE — Progress Notes (Signed)
Subjective:     Patient ID: Shelly Combs, female   DOB: 01/05/1966, 52 y.o.   MRN: [id number]  HPI Patient is here to discuss multiple issues as follows  Her major concern is fatigue.  She has started exercising a few times a week over the past few months and that seems to help some.  She has battled fatigue intermittently for years.  She sees gynecologist yearly.  She has had TSH within the past year which was normal.  She does have history of low vitamin D and has taken supplementation in the past but not consistently.  She would like to get that refilled.  She generally gets enough sleep she feels at night but sometimes poor quality sleep.  She has concerns about possible sleep apnea.  She states that she snores very loudly and has a couple times been conscious of waking up gasping for air at night.  She does feel fatigued much of the day and sometimes daytime somnolence.  She has never had any kind of sleep study.  She has history of allergic rhinitis and gingivitis.  Requesting refills of Patanol eyedrops.  History of recurrent depression currently stable on Celexa.  Previous intolerance with Zoloft.  She feels her depression is relatively stable at this time  Left plantar fascia thickened tissue along the middle aspect of the plantar fascial band.  Minimal pain.  No history of injury.  Past Medical History:  Diagnosis Date  . Allergy   . Asthma   . Cardiac arrhythmia due to congenital heart disease   . Chronic back pain   . Uterine fibroid    Past Surgical History:  Procedure Laterality Date  . GANGLION CYST EXCISION  1980  . HYSTEROSCOPY  1999   uterine fibroids    reports that she has never smoked. She has never used smokeless tobacco. She reports current alcohol use of about 2.0 standard drinks of alcohol per week. She reports that she does not use drugs. family history includes Arthritis in an other family member; Atrial fibrillation in her mother; Cancer in her paternal  grandfather and another family member; Heart disease in an other family member; Mental illness in an other family member. No Known Allergies   Review of Systems  Constitutional: Positive for fatigue. Negative for appetite change, chills, fever and unexpected weight change.  Respiratory: Negative for shortness of breath.   Cardiovascular: Negative for chest pain.  Gastrointestinal: Negative for abdominal pain.  Genitourinary: Negative for dysuria.  Neurological: Negative for weakness.  Psychiatric/Behavioral: Negative for dysphoric mood.       Objective:   Physical Exam Constitutional:      Appearance: Normal appearance.  Neck:     Musculoskeletal: Neck supple.  Cardiovascular:     Rate and Rhythm: Normal rate and regular rhythm.     Heart sounds: No murmur.  Pulmonary:     Effort: Pulmonary effort is normal.     Breath sounds: Normal breath sounds. No wheezing or rales.  Musculoskeletal:     Comments: Left foot reveals some thickened fibromatous changes along the mid aspect of the plantar fascia.  Nontender.  Lymphadenopathy:     Cervical: No cervical adenopathy.  Neurological:     Mental Status: She is alert.        Assessment:     #1 fatigue.  Question multifactorial.  Concern raised for possible obstructive sleep apnea.  Patient has never been studied previously.  #2 history of recurrent depression currently stable on Celexa  #  3 plantar fascial fibromatosis-left foot  #4 history of vitamin D deficiency  #5 bilateral allergic conjunctivitis    Plan:     -Refill Patanol drops -Discussed plantar fascial fibromatosis.  We discussed good stretching of the Achilles and plantar fascial region.  Discussed good insole supports.  No other specific therapy required at this time -Refilled vitamin D 50,000 units 1 tablet once weekly -Set up referral for consideration of home sleep study -We discussed possible addition of low-dose Wellbutrin as a more activating  antidepressant in the future if she is having ongoing fatigue issues especially in the absence of any obstructive sleep apnea  Eulas Post MD [facility] at [facility]

## 2018-10-08 ENCOUNTER — Encounter: Payer: Self-pay | Admitting: Pulmonary Disease

## 2018-10-08 ENCOUNTER — Ambulatory Visit (INDEPENDENT_AMBULATORY_CARE_PROVIDER_SITE_OTHER): Payer: BC Managed Care – PPO | Admitting: Pulmonary Disease

## 2018-10-08 VITALS — BP 112/70 | HR 72 | Ht 61.0 in | Wt 143.6 lb

## 2018-10-08 DIAGNOSIS — Z87898 Personal history of other specified conditions: Secondary | ICD-10-CM | POA: Diagnosis not present

## 2018-10-08 DIAGNOSIS — G4719 Other hypersomnia: Secondary | ICD-10-CM

## 2018-10-08 NOTE — Progress Notes (Signed)
Shelly Combs    [id number]    [birth year]  Primary Care Physician:Burchette, Alinda Sierras, MD  Referring Physician: Eulas Post, MD [place], [place] [id number]  Chief complaint:   Patient with a history of daytime sleepiness, poor sleep quality  HPI:  Patient with excessive daytime sleepiness Longstanding history of snoring Usually tries to go to bed by 10 PM, takes a few minutes to fall asleep, wakes up finally at 7 AM wakes up up to 4 times during the night Some of the awakenings is related to environmental factors-noise and also pets  She has a brother with obstructive sleep apnea  No dryness of her mouth in the morning Occasional night sweats Occasional headaches in the mornings  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 ALPRAZolam (XANAX) 0.25 MG tablet take 1 tablet by mouth as directed FOR FLYING  . azelastine (ASTELIN) 0.1 % nasal spray instill 2 sprays into each nostril twice a day as directed  . beclomethasone (QVAR REDIHALER) 80 MCG/ACT inhaler Inhale 1 puff into the lungs 2 (two) times daily.  . citalopram (CELEXA) 20 MG tablet Take 1 tablet (20 mg total) by mouth daily.  . montelukast (SINGULAIR) 10 MG tablet Take 1 tablet (10 mg total) by mouth at bedtime.  . norethindrone (MICRONOR,CAMILA,ERRIN) 0.35 MG tablet Take 1 tablet (0.35 mg total) by mouth daily.  Marland Kitchen olopatadine (PATANOL) 0.1 % ophthalmic solution Place 1 drop into both eyes daily as needed.  Marland Kitchen PROAIR HFA 108 (90 BASE) MCG/ACT inhaler Use 2 puffs every 4 hours as needed  . Vitamin D, Ergocalciferol, (DRISDOL) 1.25 MG (50000 UT) CAPS capsule Take 1 capsule (50,000 Units total) by mouth every 7 (seven) days.   No facility-administered encounter medications on file as of 10/08/2018.     Allergies as of 10/08/2018  . (No Known Allergies)    Past Medical History:  Diagnosis Date  . Allergy   . Asthma   .  Cardiac arrhythmia due to congenital heart disease   . Chronic back pain   . Uterine fibroid     Past Surgical History:  Procedure Laterality Date  . GANGLION CYST EXCISION  1980  . HYSTEROSCOPY  1999   uterine fibroids    Family History  Problem Relation Age of Onset  . Arthritis Other   . Cancer Other        colon  . Heart disease Other   . Mental illness Other   . Cancer Paternal Grandfather        colon  . Atrial fibrillation Mother     Social History   Socioeconomic History  . Marital status: Married    Spouse name: Not on file  . Number of children: Not on file  . Years of education: Not on file  . Highest education level: Not on file  Occupational History  . Not on file  Social Needs  . Financial resource strain: Not on file  . Food insecurity:    Worry: Not on file    Inability: Not on file  . Transportation needs:    Medical: Not on file    Non-medical: Not on file  Tobacco Use  . Smoking status: Never Smoker  . Smokeless tobacco: Never Used  Substance and Sexual Activity  . Alcohol use: Yes    Alcohol/week: 2.0 standard drinks    Types: 2 Glasses of wine  per week    Comment: 2 glasses of wine a night   . Drug use: No  . Sexual activity: Yes    Partners: Male    Comment: 1st intercourse- 41, partners- 60, married- 25 yrs   Lifestyle  . Physical activity:    Days per week: Not on file    Minutes per session: Not on file  . Stress: Not on file  Relationships  . Social connections:    Talks on phone: Not on file    Gets together: Not on file    Attends religious service: Not on file    Active member of club or organization: Not on file    Attends meetings of clubs or organizations: Not on file    Relationship status: Not on file  . Intimate partner violence:    Fear of current or ex partner: Not on file    Emotionally abused: Not on file    Physically abused: Not on file    Forced sexual activity: Not on file  Other Topics Concern  . Not  on file  Social History Narrative  . Not on file    Review of Systems  Constitutional: Negative.   HENT: Positive for congestion and postnasal drip.   Eyes: Negative.   Respiratory: Positive for apnea. Negative for cough and shortness of breath.   Cardiovascular: Negative.   Gastrointestinal: Negative.   Psychiatric/Behavioral: Positive for sleep disturbance.  All other systems reviewed and are negative.   Vitals:   10/08/18 1031  BP: 112/70  Pulse: 72  SpO2: 100%     Physical Exam  Constitutional: She appears well-developed and well-nourished.  HENT:  Head: Normocephalic and atraumatic.  Mall 2  Eyes: Pupils are equal, round, and reactive to light. Conjunctivae are normal. Right eye exhibits no discharge. Left eye exhibits no discharge.  Neck: Normal range of motion. Neck supple. No tracheal deviation present. No thyromegaly present.  Cardiovascular: Normal rate and regular rhythm.  Pulmonary/Chest: Effort normal and breath sounds normal. No respiratory distress. She has no wheezes.  Abdominal: Soft. Bowel sounds are normal. She exhibits no distension. There is no abdominal tenderness.   Results of the Epworth flowsheet 10/08/2018  Sitting and reading 1  Watching TV 2  Sitting, inactive in a public place (e.g. a theatre or a meeting) 0  As a passenger in a car for an hour without a break 0  Lying down to rest in the afternoon when circumstances permit 3  Sitting and talking to someone 0  Sitting quietly after a lunch without alcohol 3  In a car, while stopped for a few minutes in traffic 0  Total score 9    Assessment:   History of snoring -Long-term history of snoring -Occasional nonrestorative sleep  Daytime sleepiness    Plan/Recommendations: Pathophysiology of sleep disordered breathing discussed with patient Treatment options of sleep disordered breathing discussed with the patient  We will set the patient up with a home sleep study  I will see her  back in the office in about 3 months Encouraged to call with any significant concerns   Sherrilyn Rist MD [facility] 10/08/2018, 10:50 AM  CC: Eulas Post, MD

## 2018-10-08 NOTE — Addendum Note (Signed)
Addended by: Madolyn Frieze on: 10/08/2018 02:16 PM   Modules accepted: Orders

## 2018-10-08 NOTE — Addendum Note (Signed)
Addended by: Shirlee More R on: 10/08/2018 11:19 AM   Modules accepted: Orders

## 2018-10-08 NOTE — Patient Instructions (Signed)
Possible obstructive sleep apnea  We will set you up with a home sleep study  I will tentatively see you back in about 3 months Call with any significant concerns Sleep Apnea Sleep apnea is a condition in which breathing pauses or becomes shallow during sleep. Episodes of sleep apnea usually last 10 seconds or longer, and they may occur as many as 20 times an hour. Sleep apnea disrupts your sleep and keeps your body from getting the rest that it needs. This condition can increase your risk of certain health problems, including:  Heart attack.  Stroke.  Obesity.  Diabetes.  Heart failure.  Irregular heartbeat. There are three kinds of sleep apnea:  Obstructive sleep apnea. This kind is caused by a blocked or collapsed airway.  Central sleep apnea. This kind happens when the part of the brain that controls breathing does not send the correct signals to the muscles that control breathing.  Mixed sleep apnea. This is a combination of obstructive and central sleep apnea. What are the causes? The most common cause of this condition is a collapsed or blocked airway. An airway can collapse or become blocked if:  Your throat muscles are abnormally relaxed.  Your tongue and tonsils are larger than normal.  You are overweight.  Your airway is smaller than normal. What increases the risk? This condition is more likely to develop in people who:  Are overweight.  Smoke.  Have a smaller than normal airway.  Are elderly.  Are female.  Drink alcohol.  Take sedatives or tranquilizers.  Have a family history of sleep apnea. What are the signs or symptoms? Symptoms of this condition include:  Trouble staying asleep.  Daytime sleepiness and tiredness.  Irritability.  Loud snoring.  Morning headaches.  Trouble concentrating.  Forgetfulness.  Decreased interest in sex.  Unexplained sleepiness.  Mood swings.  Personality changes.  Feelings of  depression.  Waking up often during the night to urinate.  Dry mouth.  Sore throat. How is this diagnosed? This condition may be diagnosed with:  A medical history.  A physical exam.  A series of tests that are done while you are sleeping (sleep study). These tests are usually done in a sleep lab, but they may also be done at home. How is this treated? Treatment for this condition aims to restore normal breathing and to ease symptoms during sleep. It may involve managing health issues that can affect breathing, such as high blood pressure or obesity. Treatment may include:  Sleeping on your side.  Using a decongestant if you have nasal congestion.  Avoiding the use of depressants, including alcohol, sedatives, and narcotics.  Losing weight if you are overweight.  Making changes to your diet.  Quitting smoking.  Using a device to open your airway while you sleep, such as: ? An oral appliance. This is a custom-made mouthpiece that shifts your lower jaw forward. ? A continuous positive airway pressure (CPAP) device. This device delivers oxygen to your airway through a mask. ? A nasal expiratory positive airway pressure (EPAP) device. This device has valves that you put into each nostril. ? A bi-level positive airway pressure (BPAP) device. This device delivers oxygen to your airway through a mask.  Surgery if other treatments do not work. During surgery, excess tissue is removed to create a wider airway. It is important to get treatment for sleep apnea. Without treatment, this condition can lead to:  High blood pressure.  Coronary artery disease.  (Men) An inability to  achieve or maintain an erection (impotence).  Reduced thinking abilities. Follow these instructions at home:  Make any lifestyle changes that your health care provider recommends.  Eat a healthy, well-balanced diet.  Take over-the-counter and prescription medicines only as told by your health care  provider.  Avoid using depressants, including alcohol, sedatives, and narcotics.  Take steps to lose weight if you are overweight.  If you were given a device to open your airway while you sleep, use it only as told by your health care provider.  Do not use any tobacco products, such as cigarettes, chewing tobacco, and e-cigarettes. If you need help quitting, ask your health care provider.  Keep all follow-up visits as told by your health care provider. This is important. Contact a health care provider if:  The device that you received to open your airway during sleep is uncomfortable or does not seem to be working.  Your symptoms do not improve.  Your symptoms get worse. Get help right away if:  You develop chest pain.  You develop shortness of breath.  You develop discomfort in your back, arms, or stomach.  You have trouble speaking.  You have weakness on one side of your body.  You have drooping in your face. These symptoms may represent a serious problem that is an emergency. Do not wait to see if the symptoms will go away. Get medical help right away. Call your local emergency services (911 in the [place] Do not drive yourself to the hospital. This information is not intended to replace advice given to you by your health care provider. Make sure you discuss any questions you have with your health care provider. Document Released: 08/15/2002 Document Revised: 03/23/2017 Document Reviewed: 06/04/2015 Elsevier Interactive Patient Education  2019 [employer].

## 2018-10-30 ENCOUNTER — Other Ambulatory Visit: Payer: Self-pay | Admitting: Family Medicine

## 2018-11-24 ENCOUNTER — Encounter (HOSPITAL_BASED_OUTPATIENT_CLINIC_OR_DEPARTMENT_OTHER): Payer: Self-pay

## 2019-01-12 ENCOUNTER — Encounter: Payer: BC Managed Care – PPO | Admitting: Obstetrics & Gynecology

## 2019-02-01 ENCOUNTER — Other Ambulatory Visit: Payer: Self-pay

## 2019-02-02 ENCOUNTER — Ambulatory Visit: Payer: BC Managed Care – PPO | Admitting: Obstetrics & Gynecology

## 2019-02-02 ENCOUNTER — Encounter: Payer: Self-pay | Admitting: Obstetrics & Gynecology

## 2019-02-02 VITALS — BP 116/78 | Ht 61.5 in | Wt 147.0 lb

## 2019-02-02 DIAGNOSIS — Z01419 Encounter for gynecological examination (general) (routine) without abnormal findings: Secondary | ICD-10-CM | POA: Diagnosis not present

## 2019-02-02 DIAGNOSIS — E663 Overweight: Secondary | ICD-10-CM | POA: Diagnosis not present

## 2019-02-02 DIAGNOSIS — N951 Menopausal and female climacteric states: Secondary | ICD-10-CM

## 2019-02-02 MED ORDER — NORETHINDRONE 0.35 MG PO TABS: 1.0000 | ORAL_TABLET | Freq: Every day | ORAL | 4 refills | Status: DC

## 2019-02-02 NOTE — Progress Notes (Signed)
Shelly Combs 04/13/1966 [id number]   History:    53 y.o. G0 Married.  Adopted son is 74 yo.  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  RP:  Established patient presenting for annual gyn exam   HPI: Perimenopause, well on the Progestin-only BCPs.  No BTB.  No pelvic pain.  No pain with IC.  Urine/BMs normal.  Breasts normal.  BMI 27.33.  Will restart regular fitness activities now.  Health Labs with Fam MD.  Past medical history,surgical history, family history and social history were all reviewed and documented in the EPIC chart.  Gynecologic History No LMP recorded. (Menstrual status: Perimenopausal). Contraception: oral progesterone-only contraceptive Last Pap: 10/2016. Results were: Negative/HPV HR neg Last mammogram: 2018. Results were: Negative.  Will schedule now at [facility]. Bone Density: Never  Colonoscopy: 3 yrs ago, on a 5 yr schedule  Obstetric History OB History  Gravida Para Term Preterm AB Living  0 0 0 0 0 0  SAB TAB Ectopic Multiple Live Births  0 0 0 0 0     ROS: A ROS was performed and pertinent positives and negatives are included in the history.  GENERAL: No fevers or chills. HEENT: No change in vision, no earache, sore throat or sinus congestion. NECK: No pain or stiffness. CARDIOVASCULAR: No chest pain or pressure. No palpitations. PULMONARY: No shortness of breath, cough or wheeze. GASTROINTESTINAL: No abdominal pain, nausea, vomiting or diarrhea, melena or bright red blood per rectum. GENITOURINARY: No urinary frequency, urgency, hesitancy or dysuria. MUSCULOSKELETAL: No joint or muscle pain, no back pain, no recent trauma. DERMATOLOGIC: No rash, no itching, no lesions. ENDOCRINE: No polyuria, polydipsia, no heat or cold intolerance. No recent change in weight. HEMATOLOGICAL: No anemia or easy bruising or bleeding. NEUROLOGIC: No headache, seizures, numbness, tingling or weakness. PSYCHIATRIC: No depression, no loss of interest in normal activity or change in sleep  pattern.     Exam:   BP 116/78    Ht 5' 1.5" (1.562 m)    Wt 147 lb (66.7 kg)    BMI 27.33 kg/m   Body mass index is 27.33 kg/m.  General appearance : Well developed well nourished female. No acute distress HEENT: Eyes: no retinal hemorrhage or exudates,  Neck supple, trachea midline, no carotid bruits, no thyroidmegaly Lungs: Clear to auscultation, no rhonchi or wheezes, or rib retractions  Heart: Regular rate and rhythm, no murmurs or gallops Breast:Examined in sitting and supine position were symmetrical in appearance, no palpable masses or tenderness,  no skin retraction, no nipple inversion, no nipple discharge, no skin discoloration, no axillary or supraclavicular lymphadenopathy Abdomen: no palpable masses or tenderness, no rebound or guarding Extremities: no edema or skin discoloration or tenderness  Pelvic: Vulva: Normal             Vagina: No gross lesions or discharge  Cervix: No gross lesions or discharge.  Pap reflex done.  Uterus  AV, normal size, shape and consistency, non-tender and mobile  Adnexa  Without masses or tenderness  Anus: Normal   Assessment/Plan:  53 y.o. female for annual exam   1. Encounter for routine gynecological examination with Papanicolaou smear of cervix Normal gynecologic exam.  Pap reflex done.  Breast exam normal.  Mammogram in 2018 was negative, will schedule a screening mammogram now at [place] [facility].  Health labs with family physician.  Colonoscopy 3 years ago.  2. Perimenopause Perimenopause, well on the progestin only birth control pills.  Decision to continue on the progestin pill.  Prescription sent to pharmacy.  3. Overweight (BMI 25.0-29.9) Recommend a slightly lower calorie/carb diet and aerobic physical activities 5 times a week with weightlifting every 2 days.  Other orders - norethindrone (MICRONOR) 0.35 MG tablet; Take 1 tablet (0.35 mg total) by mouth daily.  Princess Bruins MD, 11:12 AM 02/02/2019

## 2019-02-03 ENCOUNTER — Encounter: Payer: Self-pay | Admitting: Obstetrics & Gynecology

## 2019-02-03 NOTE — Patient Instructions (Signed)
1. Encounter for routine gynecological examination with Papanicolaou smear of cervix Normal gynecologic exam.  Pap reflex done.  Breast exam normal.  Mammogram in 2018 was negative, will schedule a screening mammogram now at [place].  Health labs with family physician.  Colonoscopy 3 years ago.  2. Perimenopause Perimenopause, well on the progestin only birth control pills.  Decision to continue on the progestin pill.  Prescription sent to pharmacy.  3. Overweight (BMI 25.0-29.9) Recommend a slightly lower calorie/carb diet and aerobic physical activities 5 times a week with weightlifting every 2 days.  Other orders - norethindrone (MICRONOR) 0.35 MG tablet; Take 1 tablet (0.35 mg total) by mouth daily.  Brenlynn, it was a pleasure seeing you today!  I will inform you of your results as soon as they are available.

## 2019-02-04 LAB — PAP IG W/ RFLX HPV ASCU

## 2019-03-15 ENCOUNTER — Other Ambulatory Visit: Payer: Self-pay | Admitting: Obstetrics & Gynecology

## 2019-06-09 ENCOUNTER — Other Ambulatory Visit (HOSPITAL_COMMUNITY)
Admission: RE | Admit: 2019-06-09 | Discharge: 2019-06-09 | Disposition: A | Discharge status: Home / Self Care | Payer: BC Managed Care – PPO | Source: Ambulatory Visit | Attending: Pulmonary Disease | Admitting: Pulmonary Disease

## 2019-06-09 DIAGNOSIS — Z20828 Contact with and (suspected) exposure to other viral communicable diseases: Secondary | ICD-10-CM | POA: Diagnosis present

## 2019-06-10 LAB — NOVEL CORONAVIRUS, NAA (HOSP ORDER, SEND-OUT TO REF LAB; TAT 18-24 HRS): SARS-CoV-2, NAA: NOT DETECTED

## 2019-06-12 ENCOUNTER — Ambulatory Visit (HOSPITAL_BASED_OUTPATIENT_CLINIC_OR_DEPARTMENT_OTHER): Payer: BC Managed Care – PPO | Attending: Pulmonary Disease | Admitting: Pulmonary Disease

## 2019-06-12 ENCOUNTER — Other Ambulatory Visit: Payer: Self-pay

## 2019-06-12 DIAGNOSIS — G4700 Insomnia, unspecified: Secondary | ICD-10-CM | POA: Insufficient documentation

## 2019-06-12 DIAGNOSIS — R0683 Snoring: Secondary | ICD-10-CM | POA: Diagnosis not present

## 2019-06-12 DIAGNOSIS — Z87898 Personal history of other specified conditions: Secondary | ICD-10-CM

## 2019-06-19 ENCOUNTER — Telehealth: Payer: Self-pay | Admitting: Pulmonary Disease

## 2019-06-19 NOTE — Procedures (Signed)
POLYSOMNOGRAPHY  Last, First: Shelly, Combs MRN: [id number] Gender: Female Age (years): 53 Weight (lbs): 145 DOB: 1966/03/01 BMI: 27 Primary Care: No PCP Epworth Score: 4 Referring: Laurin Coder MD Technician: Earney Hamburg Interpreting: Laurin Coder MD Study Type: NPSG Ordered Study Type: Split Night CPAP Study date: 06/12/2019 Location:  CLINICAL INFORMATION Shelly Combs is a 53 year old Female and was referred to the sleep center for evaluation of N/A. Indications include Snoring.  MEDICATIONS Patient self administered medications include: N/A. Medications administered during study include No sleep medicine administered.  SLEEP STUDY TECHNIQUE A multi-channel overnight Polysomnography study was performed. The channels recorded and monitored were central and occipital EEG, electrooculogram (EOG), submentalis EMG (chin), nasal and oral airflow, thoracic and abdominal wall motion, anterior tibialis EMG, snore microphone, electrocardiogram, and a pulse oximetry. TECHNICIAN COMMENTS Comments added by Technician: Patient had difficulty initiating sleep. Comments added by Scorer: N/A SLEEP ARCHITECTURE The study was initiated at 10:20:38 PM and terminated at 5:01:52 AM. The total recorded time was 401.2 minutes. EEG confirmed total sleep time was 190.9 minutes yielding a sleep efficiency of 47.6%%. Sleep onset after lights out was 142.3 minutes with a REM latency of 203.0 minutes. The patient spent 1.8%% of the night in stage N1 sleep, 71.2%% in stage N2 sleep, 0.0%% in stage N3 and 27% in REM. Wake after sleep onset (WASO) was 68.0 minutes. The Arousal Index was 0.6/hour. RESPIRATORY PARAMETERS There were a total of 1 respiratory disturbances out of which 0 were apneas ( 0 obstructive, 0 mixed, 0 central) and 1 hypopneas. The apnea/hypopnea index (AHI) was 0.3 events/hour. The central sleep apnea index was 0.0 events/hour. The REM AHI was 1.2 events/hour and NREM AHI  was 0.0 events/hour. The supine AHI was 0.4 events/hour and the non supine AHI was 0 supine during 87.17% of sleep. Respiratory disturbances were associated with oxygen desaturation down to a nadir of 90.0% during sleep. The mean oxygen saturation during the study was 94.0%. The cumulative time under 88% oxygen saturation was 5.5 minutes.  LEG MOVEMENT DATA The total leg movements were 0 with a resulting leg movement index of 0.0/hr .Associated arousal with leg movement index was 0.0/hr.  CARDIAC DATA The underlying cardiac rhythm was most consistent with sinus rhythm. Mean heart rate during sleep was 61.7 bpm. Additional rhythm abnormalities include None.   IMPRESSIONS - No Significant Obstructive Sleep apnea(OSA) - EKG showed no cardiac abnormalities. - No significant Oxygen Desaturation - The patient snored with soft snoring volume. - No significant periodic leg movements(PLMs) during sleep. However, no significant associated arousals.   DIAGNOSIS - Insomnia - History of Snoring - History of non-restorative sleep - Study is negative for significant sleep disordered braething.   RECOMMENDATIONS - Optimize sleep position by avoiding supine sleep as possible, elevation of the head of the bed by about 30 degrees may help snoring. - Follow up on contributors for Insomnia - Avoid alcohol, sedatives and other CNS depressants that may worsen sleep apnea and disrupt normal sleep architecture. - Sleep hygiene should be reviewed to assess factors that may improve sleep quality. - Weight management and regular exercise should be initiated or continued.  [Electronically signed] 06/19/2019 10:35 AM  Sherrilyn Rist MD NPI: [id number]

## 2019-06-19 NOTE — Telephone Encounter (Signed)
Sleep study result  Date of study: 06/12/2019  Impression:  Study is negative for significant sleep disordered breathing  Insomnia noted during the study may be related to the environment  May need further evaluation for factors contributing to for insomnia  Recommendation:  Optimize sleep position by avoiding supine sleep as possible, elevation of the head of the bed by about 30 degrees may help snoring. Follow up on contributors for Insomnia

## 2019-06-29 NOTE — Telephone Encounter (Signed)
ATC Patient.  LM to call back for sleep results. 

## 2019-06-30 NOTE — Telephone Encounter (Signed)
ATC Patient.  LM to call back for sleep results. 

## 2019-07-04 NOTE — Telephone Encounter (Signed)
Message routed to Dr. Ander Slade for review and recommendations.  Dr. Mack Hook the patient and advised of the results. She stated that she felt the sleep test was flawed. her room was very cold and could hear the machines beeping as well as the conversations of the staff outside the room.   Patient stated although the tech provided another blanket to help her warm up, but felt the tech was too personal. Patient stated she could tell me everything about the tech's daughter and grand daughter. Was also told that she had to get in 4 hours of sleep before 2:00 am, yet was not able to get to sleep. She did take allergy medication before the test.  Patient feels that she is being charged $1,000 for a test that could not be done (based on the information above). I told the patient I would forward her concerns about the test to Dr. Ander Slade. She stated she was going into a meeting, but would be available after 5:00. If not able to reach her, a detailed voicemail can be left.  Dr. Ander Slade, based on this, can the patient possibly have a home sleep test instead? Alternatives?

## 2019-07-04 NOTE — Telephone Encounter (Signed)
Pt returned call for sleep study results

## 2019-07-08 NOTE — Telephone Encounter (Signed)
Yes.  It was noted that she did not sleep well on the night of the study.  Yes.  We can order a home sleep study I apologize that she did not have a good experience at the sleep lab.  For people that do not sleep very well in the sleep lab, only about 2 hours of sleep is required to determine if they have significant sleep disordered breathing.  This does not excuse the fact that she did not have a pleasant experience.

## 2019-07-08 NOTE — Telephone Encounter (Signed)
LMTCB

## 2019-07-11 NOTE — Telephone Encounter (Signed)
Pt called back stating that someone called her and she is not happy with the sleep study that she received - has a $1500 bill and is not happy - she states that the machine did not pick up enough data to make a reading - please call back CB# [phone].

## 2019-07-12 NOTE — Telephone Encounter (Signed)
ATC patient unable to reach LM to call back office (x2) 

## 2019-07-13 NOTE — Telephone Encounter (Signed)
LMTCB

## 2019-07-14 NOTE — Telephone Encounter (Signed)
ATC Patient.  LMTCB. Patient has been called and messages have been left on VM to call back multiple times. Will close message at this time.

## 2019-10-25 ENCOUNTER — Other Ambulatory Visit: Payer: Self-pay | Admitting: Family Medicine

## 2019-11-01 ENCOUNTER — Ambulatory Visit (INDEPENDENT_AMBULATORY_CARE_PROVIDER_SITE_OTHER): Payer: BC Managed Care – PPO | Admitting: Family Medicine

## 2019-11-01 ENCOUNTER — Other Ambulatory Visit: Payer: Self-pay

## 2019-11-01 ENCOUNTER — Encounter: Payer: Self-pay | Admitting: Family Medicine

## 2019-11-01 VITALS — Ht 62.0 in

## 2019-11-01 DIAGNOSIS — F419 Anxiety disorder, unspecified: Secondary | ICD-10-CM

## 2019-11-01 DIAGNOSIS — E785 Hyperlipidemia, unspecified: Secondary | ICD-10-CM

## 2019-11-01 DIAGNOSIS — R5383 Other fatigue: Secondary | ICD-10-CM | POA: Diagnosis not present

## 2019-11-01 DIAGNOSIS — J301 Allergic rhinitis due to pollen: Secondary | ICD-10-CM

## 2019-11-01 LAB — CBC WITH DIFFERENTIAL/PLATELET
Basophils Absolute: 0.1 10*3/uL (ref 0.0–0.1)
Basophils Relative: 1.1 % (ref 0.0–3.0)
Eosinophils Absolute: 0.2 10*3/uL (ref 0.0–0.7)
Eosinophils Relative: 2.2 % (ref 0.0–5.0)
HCT: 42.7 % (ref 36.0–46.0)
Hemoglobin: 14.2 g/dL (ref 12.0–15.0)
Lymphocytes Relative: 26.5 % (ref 12.0–46.0)
Lymphs Abs: 2 10*3/uL (ref 0.7–4.0)
MCHC: 33.2 g/dL (ref 30.0–36.0)
MCV: 93.4 fl (ref 78.0–100.0)
Monocytes Absolute: 0.7 10*3/uL (ref 0.1–1.0)
Monocytes Relative: 9.7 % (ref 3.0–12.0)
Neutro Abs: 4.5 10*3/uL (ref 1.4–7.7)
Neutrophils Relative %: 60.5 % (ref 43.0–77.0)
Platelets: 296 10*3/uL (ref 150.0–400.0)
RBC: 4.57 Mil/uL (ref 3.87–5.11)
RDW: 12.2 % (ref 11.5–15.5)
WBC: 7.4 10*3/uL (ref 4.0–10.5)

## 2019-11-01 LAB — HEPATIC FUNCTION PANEL
ALT: 19 U/L (ref 0–35)
AST: 21 U/L (ref 0–37)
Albumin: 4.5 g/dL (ref 3.5–5.2)
Alkaline Phosphatase: 64 U/L (ref 39–117)
Bilirubin, Direct: 0.1 mg/dL (ref 0.0–0.3)
Total Bilirubin: 0.4 mg/dL (ref 0.2–1.2)
Total Protein: 6.8 g/dL (ref 6.0–8.3)

## 2019-11-01 LAB — BASIC METABOLIC PANEL
BUN: 16 mg/dL (ref 6–23)
CO2: 28 mEq/L (ref 19–32)
Calcium: 9.8 mg/dL (ref 8.4–10.5)
Chloride: 106 mEq/L (ref 96–112)
Creatinine, Ser: 0.91 mg/dL (ref 0.40–1.20)
GFR: 64.39 mL/min (ref >60.00)
Glucose, Bld: 85 mg/dL (ref 70–99)
Potassium: 4.9 mEq/L (ref 3.5–5.1)
Sodium: 141 mEq/L (ref 135–145)

## 2019-11-01 LAB — LIPID PANEL
Cholesterol: 242 mg/dL — ABNORMAL HIGH (ref 0–200)
HDL: 64.4 mg/dL (ref >39.00)
LDL Cholesterol: 160 mg/dL — ABNORMAL HIGH (ref 0–99)
NonHDL: 177.63
Total CHOL/HDL Ratio: 4
Triglycerides: 86 mg/dL (ref 0.0–149.0)
VLDL: 17.2 mg/dL (ref 0.0–40.0)

## 2019-11-01 LAB — TSH: TSH: 2.34 u[IU]/mL (ref 0.35–4.50)

## 2019-11-01 MED ORDER — CITALOPRAM HYDROBROMIDE 20 MG PO TABS: 20.0000 mg | ORAL_TABLET | Freq: Every day | ORAL | 3 refills | Status: DC

## 2019-11-01 MED ORDER — MONTELUKAST SODIUM 10 MG PO TABS: 10.0000 mg | ORAL_TABLET | Freq: Every day | ORAL | 3 refills | Status: DC

## 2019-11-01 NOTE — Progress Notes (Signed)
Subjective:     Patient ID: Shelly Combs, female   DOB: 1965/11/23, 54 y.o.   MRN: [id number]  HPI   Medical follow-up.  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  Generally doing well.  She states that she and her husband have increased their alcohol consumption somewhat.  Usually drinks about 2 glasses of wine per day.  She does have some insomnia issues and is aware of potential link between alcohol consumption at night and insomnia.  She has history of asthma and allergies which are generally fairly well controlled.  Requesting refills of Singulair.  She also supplements with Claritin.  She has had some general fatigue issues which may be largely related to not getting great quality sleep.  She is exercising some.  No recent chest pains or dizziness.  She takes Celexa for anxiety symptoms and that seems to be working fairly well.  She has been on this several years and has some reluctance to discontinue.  No major side effects.  Family history significant for father with Alzheimer's dementia.  He and Shelly Combs's mother live down in [place].  Past Medical History:  Diagnosis Date  . Allergy   . Asthma   . Cardiac arrhythmia due to congenital heart disease   . Chronic back pain   . Uterine fibroid    Past Surgical History:  Procedure Laterality Date  . GANGLION CYST EXCISION  1980  . HYSTEROSCOPY  1999   uterine fibroids    reports that she has never smoked. She has never used smokeless tobacco. She reports current alcohol use of about 2.0 standard drinks of alcohol per week. She reports that she does not use drugs. family history includes Arthritis in an other family member; Atrial fibrillation in her mother; Cancer in her paternal grandfather and another family member; Heart disease in an other family member; Mental illness in an other family member. No Known Allergies  Wt Readings from Last 3 Encounters:  06/12/19 145  lb (65.8 kg)  02/02/19 147 lb (66.7 kg)  10/08/18 143 lb 9.6 oz (65.1 kg)   The 10-year ASCVD risk score [name] DC Jr., et al., 2013) is: 1.6%   Values used to calculate the score:     Age: 45 years     Sex: Female     Is Non-Hispanic African American: No     Diabetic: No     Tobacco smoker: No     Systolic Blood Pressure: 99991111 mmHg     Is BP treated: No     HDL Cholesterol: 64.4 mg/dL     Total Cholesterol: 242 mg/dL   Review of Systems  Constitutional: Negative for fatigue.  Eyes: Negative for visual disturbance.  Respiratory: Negative for cough, chest tightness, shortness of breath and wheezing.   Cardiovascular: Negative for chest pain, palpitations and leg swelling.  Neurological: Negative for dizziness, seizures, syncope, weakness, light-headedness and headaches.  Psychiatric/Behavioral: Positive for sleep disturbance. Negative for dysphoric mood.       Objective:   Physical Exam Vitals reviewed.  Constitutional:      Appearance: Normal appearance.  HENT:     Right Ear: Tympanic membrane and ear canal normal.     Left Ear: Tympanic membrane and ear canal normal.  Cardiovascular:     Rate and Rhythm: Normal rate and regular rhythm.  Pulmonary:     Effort: Pulmonary effort is normal.     Breath  sounds: Normal breath sounds.  Neurological:     General: No focal deficit present.     Mental Status: She is alert and oriented to person, place, and time.     Cranial Nerves: No cranial nerve deficit.        Assessment:     #1 seasonal and perennial allergies stable on Singulair and Claritin  #2 history of chronic anxiety.  She has had some posttraumatic stress symptoms.  Symptoms stable on Celexa 20 mg daily  #3 insomnia which may be at least partly related to alcohol use at night  #4 fatigue probably at least partly related to #3  #5 history of reported hyperlipidemia    Plan:     -Discussed importance of avoidance of regular alcohol use later at night in  relation to insomnia issues  -Refill Singulair and Celexa for 1 year  -Obtain follow-up labs including TSH, chemistries, CBC to evaluate her fatigue symptoms  -Check fasting lipids  Eulas Post MD [facility]

## 2020-02-02 ENCOUNTER — Other Ambulatory Visit: Payer: Self-pay

## 2020-02-03 ENCOUNTER — Encounter: Payer: Self-pay | Admitting: Obstetrics & Gynecology

## 2020-02-03 ENCOUNTER — Ambulatory Visit: Payer: BC Managed Care – PPO | Admitting: Obstetrics & Gynecology

## 2020-02-03 VITALS — BP 126/78 | Ht 61.75 in | Wt 145.8 lb

## 2020-02-03 DIAGNOSIS — Z01419 Encounter for gynecological examination (general) (routine) without abnormal findings: Secondary | ICD-10-CM

## 2020-02-03 DIAGNOSIS — N951 Menopausal and female climacteric states: Secondary | ICD-10-CM

## 2020-02-03 DIAGNOSIS — E663 Overweight: Secondary | ICD-10-CM

## 2020-02-03 NOTE — Progress Notes (Signed)
Shelly Combs Sep 23, 1965 [id number]   History:    54 y.o. G0 Married.  Adopted son is 77 yo.  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  RP:  Established patient presenting for annual gyn exam   HPI: Perimenopause, well on the Progestin-only BCPs.  No BTB.  Worsening hot flashes and night sweats every day.  No pelvic pain.  No pain with IC.  Urine/BMs normal.  Breasts normal.  BMI 26.88.  Regular fitness activities.  Health Labs with Fam MD.   Past medical history,surgical history, family history and social history were all reviewed and documented in the EPIC chart.  Gynecologic History No LMP recorded. (Menstrual status: Perimenopausal).  Obstetric History OB History  Gravida Para Term Preterm AB Living  0 0 0 0 0 0  SAB TAB Ectopic Multiple Live Births  0 0 0 0 0     ROS: A ROS was performed and pertinent positives and negatives are included in the history.  GENERAL: No fevers or chills. HEENT: No change in vision, no earache, sore throat or sinus congestion. NECK: No pain or stiffness. CARDIOVASCULAR: No chest pain or pressure. No palpitations. PULMONARY: No shortness of breath, cough or wheeze. GASTROINTESTINAL: No abdominal pain, nausea, vomiting or diarrhea, melena or bright red blood per rectum. GENITOURINARY: No urinary frequency, urgency, hesitancy or dysuria. MUSCULOSKELETAL: No joint or muscle pain, no back pain, no recent trauma. DERMATOLOGIC: No rash, no itching, no lesions. ENDOCRINE: No polyuria, polydipsia, no heat or cold intolerance. No recent change in weight. HEMATOLOGICAL: No anemia or easy bruising or bleeding. NEUROLOGIC: No headache, seizures, numbness, tingling or weakness. PSYCHIATRIC: No depression, no loss of interest in normal activity or change in sleep pattern.     Exam:   BP 126/78   Ht 5' 1.75" (1.568 m)   Wt 145 lb 12.8 oz (66.1 kg)   BMI 26.88 kg/m   Body mass index is 26.88 kg/m.  General appearance : Well developed well nourished  female. No acute distress HEENT: Eyes: no retinal hemorrhage or exudates,  Neck supple, trachea midline, no carotid bruits, no thyroidmegaly Lungs: Clear to auscultation, no rhonchi or wheezes, or rib retractions  Heart: Regular rate and rhythm, no murmurs or gallops Breast:Examined in sitting and supine position were symmetrical in appearance, no palpable masses or tenderness,  no skin retraction, no nipple inversion, no nipple discharge, no skin discoloration, no axillary or supraclavicular lymphadenopathy Abdomen: no palpable masses or tenderness, no rebound or guarding Extremities: no edema or skin discoloration or tenderness  Pelvic: Vulva: Normal             Vagina: No gross lesions or discharge  Cervix: No gross lesions or discharge  Uterus  AV, normal size, shape and consistency, non-tender and mobile  Adnexa  Without masses or tenderness  Anus: Normal   Assessment/Plan:  54 y.o. female for annual exam   1. Well female exam with routine gynecological exam Normal gynecologic exam. Last Pap test May 2020 was negative, no indication to repeat this year. Breast exam normal. Screening mammogram August 2020 was negative. Health labs with family physician. Colonoscopy 4 years ago.  2. Perimenopause Probably entering menopause. Decision to stop progestin only birth control pills and observe. History of infertility. We will try to control menopausal vasomotor symptoms with Phyto-Estrogens. If not sufficient, will schedule a visit for management of menopause.  3. Overweight (BMI 25.0-29.9) Low calorie/carb diet such as [employer]. Aerobic activities 5 times a week and  light weightlifting every 2 days recommended.  Princess Bruins MD, 10:13 AM 02/03/2020

## 2020-02-03 NOTE — Patient Instructions (Signed)
1. Well female exam with routine gynecological exam Normal gynecologic exam. Last Pap test May 2020 was negative, no indication to repeat this year. Breast exam normal. Screening mammogram August 2020 was negative. Health labs with family physician. Colonoscopy 4 years ago.  2. Perimenopause Probably entering menopause. Decision to stop progestin only birth control pills and observe. History of infertility. We will try to control menopausal vasomotor symptoms with Phyto-Estrogens. If not sufficient, will schedule a visit for management of menopause.  3. Overweight (BMI 25.0-29.9) Low calorie/carb diet such as [employer]. Aerobic activities 5 times a week and light weightlifting every 2 days recommended.  Shelly Combs, it was a pleasure seeing you today!

## 2020-03-28 ENCOUNTER — Encounter: Payer: Self-pay | Admitting: Family Medicine

## 2020-03-28 ENCOUNTER — Other Ambulatory Visit: Payer: Self-pay

## 2020-03-28 ENCOUNTER — Ambulatory Visit: Payer: BC Managed Care – PPO | Admitting: Family Medicine

## 2020-03-28 VITALS — BP 118/60 | HR 76 | Temp 98.2°F | Wt 150.2 lb

## 2020-03-28 DIAGNOSIS — K644 Residual hemorrhoidal skin tags: Secondary | ICD-10-CM

## 2020-03-28 DIAGNOSIS — K5900 Constipation, unspecified: Secondary | ICD-10-CM

## 2020-03-28 MED ORDER — PRAMOXINE-HC 1-2.5 % EX CREA: TOPICAL_CREAM | Freq: Three times a day (TID) | CUTANEOUS | 0 refills | Status: DC

## 2020-03-28 NOTE — Patient Instructions (Signed)

## 2020-03-28 NOTE — Progress Notes (Signed)
Established Patient Office Visit  Subjective:  Patient ID: Shelly Combs, female    DOB: 18-Feb-1966  Age: 54 y.o. MRN: [id number]  CC:  Chief Complaint  Patient presents with  . Hemorrhoids    pt states has been having internal painful hemmorrhoids for about a week     HPI Shelly Combs presents for onset 8 days ago of some perianal irritation.  She noticed low-grade bright red blood per rectum and mild pain with bowel movement.  She has had some recent constipation.  She was concerned about possible hemorrhoids.  She apparently had hemorrhoids in the past.  She did have colonoscopy age 49 and apparently no polyps were found.  She did have some internal hemorrhoids then-by her report.  She took some type of laxative couple days ago that helped slightly with her bowel movement.  No recent appetite change.  No unexplained weight loss. No abdominal pain.  Past Medical History:  Diagnosis Date  . Allergy   . Asthma   . Cardiac arrhythmia due to congenital heart disease   . Chronic back pain   . Uterine fibroid     Past Surgical History:  Procedure Laterality Date  . GANGLION CYST EXCISION  1980  . HYSTEROSCOPY  1999   uterine fibroids    Family History  Problem Relation Age of Onset  . Arthritis Other   . Cancer Other        colon  . Heart disease Other   . Mental illness Other   . Cancer Paternal Grandfather        colon  . Atrial fibrillation Mother     Social History   Socioeconomic History  . Marital status: Married    Spouse name: Not on file  . Number of children: Not on file  . Years of education: Not on file  . Highest education level: Not on file  Occupational History  . Not on file  Tobacco Use  . Smoking status: Never Smoker  . Smokeless tobacco: Never Used  Vaping Use  . Vaping Use: Never used  Substance and Sexual Activity  . Alcohol use: Yes    Alcohol/week: 2.0 standard drinks    Types: 2 Glasses of wine per week    Comment: 2 glasses of wine  a night   . Drug use: No  . Sexual activity: Yes    Partners: Male    Comment: 1st intercourse- 28, partners- 71, married- 25 yrs   Other Topics Concern  . Not on file  Social History Narrative  . Not on file   Social Determinants of Health   Financial Resource Strain:   . Difficulty of Paying Living Expenses:   Food Insecurity:   . Worried About Charity fundraiser in the Last Year:   . Arboriculturist in the Last Year:   Transportation Needs:   . Film/video editor (Medical):   Marland Kitchen Lack of Transportation (Non-Medical):   Physical Activity:   . Days of Exercise per Week:   . Minutes of Exercise per Session:   Stress:   . Feeling of Stress :   Social Connections:   . Frequency of Communication with Friends and Family:   . Frequency of Social Gatherings with Friends and Family:   . Attends Religious Services:   . Active Member of Clubs or Organizations:   . Attends Archivist Meetings:   Marland Kitchen Marital Status:   Intimate Partner Violence:   .  Fear of Current or Ex-Partner:   . Emotionally Abused:   Marland Kitchen Physically Abused:   . Sexually Abused:     Outpatient Medications Prior to Visit  Medication Sig Dispense Refill  . ALPRAZolam (XANAX) 0.25 MG tablet take 1 tablet by mouth as directed FOR FLYING 30 tablet 0  . azelastine (ASTELIN) 0.1 % nasal spray instill 2 sprays into each nostril twice a day as directed 30 mL 0  . beclomethasone (QVAR REDIHALER) 80 MCG/ACT inhaler Inhale 1 puff into the lungs 2 (two) times daily. 3 Inhaler 3  . citalopram (CELEXA) 20 MG tablet Take 1 tablet (20 mg total) by mouth daily. 90 tablet 3  . montelukast (SINGULAIR) 10 MG tablet Take 1 tablet (10 mg total) by mouth at bedtime. 90 tablet 3  . norethindrone (MICRONOR) 0.35 MG tablet TAKE 1 TABLET(0.35 MG) BY MOUTH DAILY 84 tablet 3  . olopatadine (PATANOL) 0.1 % ophthalmic solution Place 1 drop into both eyes daily as needed. 5 mL 2  . PROAIR HFA 108 (90 BASE) MCG/ACT inhaler Use 2 puffs  every 4 hours as needed 17 g 3  . Vitamin D, Ergocalciferol, (DRISDOL) 1.25 MG (50000 UT) CAPS capsule Take 1 capsule (50,000 Units total) by mouth every 7 (seven) days. 12 capsule 1   No facility-administered medications prior to visit.    No Known Allergies  ROS Review of Systems  Constitutional: Negative for appetite change and unexpected weight change.  Gastrointestinal: Positive for blood in stool and constipation. Negative for abdominal distention, abdominal pain, nausea and vomiting.      Objective:    Physical Exam Vitals reviewed.  Constitutional:      Appearance: Normal appearance.  Cardiovascular:     Rate and Rhythm: Normal rate and regular rhythm.  Pulmonary:     Effort: Pulmonary effort is normal.     Breath sounds: Normal breath sounds.  Genitourinary:    Comments: She has small external hemorrhoid around 3 o'clock position.  This is tender to palpation.  We did not appreciate any anal fissures.  Digital exam was slightly painful but no rectal nodules or masses noted.  Stool was brown and Hemoccult negative Neurological:     Mental Status: She is alert.     BP 118/60 (BP Location: Left Arm, Patient Position: Sitting, Cuff Size: Normal)   Pulse 76   Temp 98.2 F (36.8 C) (Oral)   Wt 150 lb 3.2 oz (68.1 kg)   SpO2 98%   BMI 27.69 kg/m  Wt Readings from Last 3 Encounters:  03/28/20 150 lb 3.2 oz (68.1 kg)  02/03/20 145 lb 12.8 oz (66.1 kg)  06/12/19 145 lb (65.8 kg)     Health Maintenance Due  Topic Date Due  . Hepatitis C Screening  Never done  . HIV Screening  Never done  . TETANUS/TDAP  Never done  . COLONOSCOPY  Never done    There are no preventive care reminders to display for this patient.  Lab Results  Component Value Date   TSH 2.34 11/01/2019   Lab Results  Component Value Date   WBC 7.4 11/01/2019   HGB 14.2 11/01/2019   HCT 42.7 11/01/2019   MCV 93.4 11/01/2019   PLT 296.0 11/01/2019   Lab Results  Component Value Date    NA 141 11/01/2019   K 4.9 11/01/2019   CO2 28 11/01/2019   GLUCOSE 85 11/01/2019   BUN 16 11/01/2019   CREATININE 0.91 11/01/2019   BILITOT 0.4 11/01/2019  ALKPHOS 64 11/01/2019   AST 21 11/01/2019   ALT 19 11/01/2019   PROT 6.8 11/01/2019   ALBUMIN 4.5 11/01/2019   CALCIUM 9.8 11/01/2019   GFR 64.39 11/01/2019   Lab Results  Component Value Date   CHOL 242 (H) 11/01/2019   Lab Results  Component Value Date   HDL 64.40 11/01/2019   Lab Results  Component Value Date   LDLCALC 160 (H) 11/01/2019   Lab Results  Component Value Date   TRIG 86.0 11/01/2019   Lab Results  Component Value Date   CHOLHDL 4 11/01/2019   No results found for: HGBA1C    Assessment & Plan:   External hemorrhoid.  No evidence for thrombosis-but increasingly symptomatic past several days  -Recommend plenty of fluids and fiber -Consider over-the-counter MiraLAX as needed for constipation -Recommend sitz baths once or twice daily -We will prescribe Proctofoam HC to use topically as needed for discomfort  No orders of the defined types were placed in this encounter.   Follow-up: No follow-ups on file.    Carolann Littler, MD

## 2020-06-08 ENCOUNTER — Encounter: Payer: Self-pay | Admitting: Family Medicine

## 2020-06-08 ENCOUNTER — Other Ambulatory Visit: Payer: Self-pay

## 2020-06-08 ENCOUNTER — Ambulatory Visit (INDEPENDENT_AMBULATORY_CARE_PROVIDER_SITE_OTHER): Payer: BC Managed Care – PPO | Admitting: Family Medicine

## 2020-06-08 VITALS — BP 120/80 | HR 69 | Temp 98.3°F | Ht 62.0 in | Wt 147.8 lb

## 2020-06-08 DIAGNOSIS — J301 Allergic rhinitis due to pollen: Secondary | ICD-10-CM | POA: Diagnosis not present

## 2020-06-08 DIAGNOSIS — H6983 Other specified disorders of Eustachian tube, bilateral: Secondary | ICD-10-CM

## 2020-06-08 DIAGNOSIS — F419 Anxiety disorder, unspecified: Secondary | ICD-10-CM | POA: Diagnosis not present

## 2020-06-08 MED ORDER — METHYLPREDNISOLONE ACETATE 40 MG/ML IJ SUSP
40.0000 mg | Freq: Once | INTRAMUSCULAR | Status: AC
  Administered 2020-06-08: 40 mg via INTRAMUSCULAR

## 2020-06-08 MED ORDER — METHYLPREDNISOLONE ACETATE 80 MG/ML IJ SUSP
80.0000 mg | Freq: Once | INTRAMUSCULAR | Status: AC
  Administered 2020-06-08: 80 mg via INTRAMUSCULAR

## 2020-06-08 MED ORDER — ALPRAZOLAM 0.25 MG PO TABS: 0.2500 mg | ORAL_TABLET | Freq: Two times a day (BID) | ORAL | 0 refills | Status: DC | PRN

## 2020-06-08 NOTE — Addendum Note (Signed)
Addended by: Matilde Sprang on: 06/08/2020 03:46 PM   Modules accepted: Orders

## 2020-06-08 NOTE — Progress Notes (Signed)
° °  Subjective:    Patient ID: Shelly Combs, female    DOB: Feb 03, 1966, 54 y.o.   MRN: [id number]  HPI Here for several issues. First she has had ear pain for the past 2 weeks, and she is worried because she will be flying to [place] tomorrow morning. She has some sinus congestion but no PND or ST or fever or cough. She has allergies and she uses Flonase and Claritin intermittently. She has not used these on several months. Also she asks for a supply of Xanax. The first reason for this is she gets very anxious while flying. The second is she will be flying to spend time with her father who is "actively dying" from [place], and this will naturally be difficult for her.    Review of Systems  Constitutional: Negative.   HENT: Positive for congestion, ear pain, hearing loss and sinus pressure. Negative for postnasal drip and sore throat.   Eyes: Negative.   Respiratory: Negative.   Psychiatric/Behavioral: Negative for dysphoric mood. The patient is nervous/anxious.        Objective:   Physical Exam Constitutional:      Appearance: Normal appearance.  HENT:     Right Ear: Tympanic membrane, ear canal and external ear normal.     Left Ear: Tympanic membrane, ear canal and external ear normal.     Nose: Nose normal.     Mouth/Throat:     Pharynx: Oropharynx is clear.  Eyes:     Conjunctiva/sclera: Conjunctivae normal.  Cardiovascular:     Rate and Rhythm: Normal rate and regular rhythm.     Pulses: Normal pulses.     Heart sounds: Normal heart sounds.  Pulmonary:     Effort: Pulmonary effort is normal.     Breath sounds: Normal breath sounds.  Lymphadenopathy:     Cervical: No cervical adenopathy.  Neurological:     Mental Status: She is alert.  Psychiatric:        Mood and Affect: Mood normal.        Behavior: Behavior normal.        Thought Content: Thought content normal.        Judgment: Judgment normal.           Assessment & Plan:  Eustachian tube dysfunction  with allergies, we will give her a shot of DepoMedrol. I advised her to use Claritin and Flonase every day. For the anxiety, we refilled the Xanax. Alysia Penna, MD

## 2020-06-26 ENCOUNTER — Encounter: Payer: Self-pay | Admitting: Family Medicine

## 2020-06-26 ENCOUNTER — Other Ambulatory Visit: Payer: Self-pay

## 2020-06-26 MED ORDER — BUPROPION HCL ER (XL) 150 MG PO TB24: 150.0000 mg | ORAL_TABLET | Freq: Every day | ORAL | 1 refills | Status: DC

## 2020-07-17 ENCOUNTER — Encounter: Payer: Self-pay | Admitting: Obstetrics & Gynecology

## 2020-07-17 ENCOUNTER — Ambulatory Visit: Payer: BC Managed Care – PPO | Admitting: Obstetrics & Gynecology

## 2020-07-17 ENCOUNTER — Other Ambulatory Visit: Payer: Self-pay

## 2020-07-17 VITALS — BP 126/80

## 2020-07-17 DIAGNOSIS — N951 Menopausal and female climacteric states: Secondary | ICD-10-CM | POA: Diagnosis not present

## 2020-07-17 DIAGNOSIS — F4321 Adjustment disorder with depressed mood: Secondary | ICD-10-CM | POA: Diagnosis not present

## 2020-07-17 DIAGNOSIS — Z79890 Hormone replacement therapy (postmenopausal): Secondary | ICD-10-CM | POA: Diagnosis not present

## 2020-07-17 DIAGNOSIS — Z23 Encounter for immunization: Secondary | ICD-10-CM | POA: Diagnosis not present

## 2020-07-17 MED ORDER — ESTRADIOL 0.05 MG/24HR TD PTTW: 1.0000 | MEDICATED_PATCH | TRANSDERMAL | 4 refills | Status: DC

## 2020-07-17 MED ORDER — CLOBETASOL PROPIONATE 0.05 % EX OINT: 1.0000 "application " | TOPICAL_OINTMENT | Freq: Two times a day (BID) | CUTANEOUS | 1 refills | Status: DC

## 2020-07-17 MED ORDER — PROGESTERONE MICRONIZED 100 MG PO CAPS: 100.0000 mg | ORAL_CAPSULE | Freq: Every day | ORAL | 4 refills | Status: DC

## 2020-07-17 NOTE — Progress Notes (Signed)
    Shelly Combs 1966-06-17 [id number]        54 y.o.  G0 Married.  Adopted son is 75 yo.  Father died recently.  RP: Menopausal syndrome for counseling on HRT  HPI: Hot flushes and night sweats with insomnia.  Clouded mind and attention deficit symptoms.  Grieving.  On an Antidepressant.  No suicidal ideation.   OB History  Gravida Para Term Preterm AB Living  0 0 0 0 0 0  SAB TAB Ectopic Multiple Live Births  0 0 0 0 0    Past medical history,surgical history, problem list, medications, allergies, family history and social history were all reviewed and documented in the EPIC chart.   Directed ROS with pertinent positives and negatives documented in the history of present illness/assessment and plan.  Exam:  Vitals:   07/17/20 1441  BP: 126/80   General appearance:  Normal  Gyn exam deferred   Assessment/Plan:  54 y.o. G0   1. Menopausal syndrome Very symptomatic menopause with hot flushes and night sweats with secondary insomnia.  Also having a clouded mind with difficulty focusing.  Depression controlled on antidepressants with no suicidal ideation.  Patient grieving the loss of her father recently.  Counseling on hormone replacement therapy done.  No contraindication.  Benefits and risks of hormone therapy reviewed in details including the risks of blood clots/strokes with age and increased risk of breast cancer after 10 years of use.  Decision to start on estradiol patch 0.05 twice a week and progesterone 100 mg 1 capsule daily at bedtime.  Usage and precautions reviewed.  Prescription sent to pharmacy.  2. Menopausal syndrome on hormone replacement therapy Decision to start on hormone replacement therapy.  No contraindication.  3. Grieving Normal grieving of the recent passing of her father.  4. Flu vaccine need Flu shot given.  Other orders - Flu Vaccine QUAD 36+ mos IM (Fluarix, Quad PF) - estradiol (VIVELLE-DOT) 0.05 MG/24HR patch; Place 1 patch (0.05 mg  total) onto the skin 2 (two) times a week. - progesterone (PROMETRIUM) 100 MG capsule; Take 1 capsule (100 mg total) by mouth daily.  Princess Bruins MD, 3:02 PM 07/17/2020

## 2020-07-22 ENCOUNTER — Encounter: Payer: Self-pay | Admitting: Obstetrics & Gynecology

## 2020-10-19 ENCOUNTER — Encounter: Payer: Self-pay | Admitting: Family Medicine

## 2020-10-19 ENCOUNTER — Ambulatory Visit (INDEPENDENT_AMBULATORY_CARE_PROVIDER_SITE_OTHER): Payer: Self-pay | Admitting: Family Medicine

## 2020-10-19 ENCOUNTER — Other Ambulatory Visit: Payer: Self-pay

## 2020-10-19 VITALS — BP 118/76 | HR 61 | Ht 62.0 in | Wt 148.0 lb

## 2020-10-19 DIAGNOSIS — Z1211 Encounter for screening for malignant neoplasm of colon: Secondary | ICD-10-CM | POA: Insufficient documentation

## 2020-10-19 DIAGNOSIS — Z800 Family history of malignant neoplasm of digestive organs: Secondary | ICD-10-CM | POA: Insufficient documentation

## 2020-10-19 DIAGNOSIS — M19042 Primary osteoarthritis, left hand: Secondary | ICD-10-CM

## 2020-10-19 DIAGNOSIS — J301 Allergic rhinitis due to pollen: Secondary | ICD-10-CM

## 2020-10-19 DIAGNOSIS — M19041 Primary osteoarthritis, right hand: Secondary | ICD-10-CM

## 2020-10-19 DIAGNOSIS — F419 Anxiety disorder, unspecified: Secondary | ICD-10-CM

## 2020-10-19 MED ORDER — CITALOPRAM HYDROBROMIDE 20 MG PO TABS: 20.0000 mg | ORAL_TABLET | Freq: Every day | ORAL | 3 refills | Status: DC

## 2020-10-19 MED ORDER — MONTELUKAST SODIUM 10 MG PO TABS: 10.0000 mg | ORAL_TABLET | Freq: Every day | ORAL | 3 refills | Status: DC

## 2020-10-19 NOTE — Progress Notes (Addendum)
Established Patient Office Visit  Subjective:  Patient ID: Shelly Combs, female    DOB: 08-16-1966  Age: 55 y.o. MRN: [id number]  CC:  Chief Complaint  Patient presents with   Pain    HPI CLYDIA NIEVES presents for several issues as follows  She had some progressive arthritis type pains in her hands.  Predominantly thumbs CMC and MCP joints.  She also has some increased mobility in her joints which she has had basically all of her life.  She is not aware of any family history of connective tissue disorder.  She takes Tylenol for her hand arthritis symptoms which seems to help.  She has history of anxiety.  She has been on Celexa 20 mg daily for several years.  This generally works fairly well.  We tried addition of Wellbutrin at one point but she did not tolerate.  She took 1 dose and had very vivid dreams.  She does question whether she may have component of ADD.  She has never been formally tested.  She is considering whether she may get tested.  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  She has history of seasonal and perennial allergies.  She remains on Singulair 10 mg daily and requests refills.  Allergies are fairly well controlled.  Past Medical History:  Diagnosis Date   Allergy    Asthma    Cardiac arrhythmia due to congenital heart disease    Chronic back pain    Uterine fibroid     Past Surgical History:  Procedure Laterality Date   GANGLION CYST EXCISION  1980   HYSTEROSCOPY  1999   uterine fibroids    Family History  Problem Relation Age of Onset   Arthritis Other    Cancer Other        colon   Heart disease Other    Mental illness Other    Cancer Paternal Grandfather        colon   Atrial fibrillation Mother     Social History   Socioeconomic History   Marital status: Married    Spouse name: Not on file   Number of children: Not on file   Years of education: Not on file   Highest education level: Not  on file  Occupational History   Not on file  Tobacco Use   Smoking status: Never Smoker   Smokeless tobacco: Never Used  Vaping Use   Vaping Use: Never used  Substance and Sexual Activity   Alcohol use: Yes    Alcohol/week: 2.0 standard drinks    Types: 2 Glasses of wine per week    Comment: 2 glasses of wine a night    Drug use: No   Sexual activity: Yes    Partners: Male    Comment: 1st intercourse- 89, partners- 4, married- 25 yrs   Other Topics Concern   Not on file  Social History Narrative   Not on file   Social Determinants of Health   Financial Resource Strain: Not on file  Food Insecurity: Not on file  Transportation Needs: Not on file  Physical Activity: Not on file  Stress: Not on file  Social Connections: Not on file  Intimate Partner Violence: Not on file    Outpatient Medications Prior to Visit  Medication Sig Dispense Refill   azelastine (ASTELIN) 0.1 % nasal spray instill 2 sprays into each nostril twice a day as directed 30 mL 0   beclomethasone (  QVAR REDIHALER) 80 MCG/ACT inhaler Inhale 1 puff into the lungs 2 (two) times daily. 3 Inhaler 3   estradiol (VIVELLE-DOT) 0.05 MG/24HR patch Place 1 patch (0.05 mg total) onto the skin 2 (two) times a week. 24 patch 4   olopatadine (PATANOL) 0.1 % ophthalmic solution Place 1 drop into both eyes daily as needed. 5 mL 2   PROAIR HFA 108 (90 BASE) MCG/ACT inhaler Use 2 puffs every 4 hours as needed 17 g 3   progesterone (PROMETRIUM) 100 MG capsule Take 1 capsule (100 mg total) by mouth daily. 90 capsule 4   citalopram (CELEXA) 20 MG tablet Take 1 tablet (20 mg total) by mouth daily. 90 tablet 3   montelukast (SINGULAIR) 10 MG tablet Take 1 tablet (10 mg total) by mouth at bedtime. 90 tablet 3   ALPRAZolam (XANAX) 0.25 MG tablet Take 1 tablet (0.25 mg total) by mouth 2 (two) times daily as needed for anxiety. (Patient not taking: Reported on 07/17/2020) 60 tablet 0   pramoxine-hydrocortisone cream  Apply topically 3 (three) times daily. 57 g 0   No facility-administered medications prior to visit.    Allergies  Allergen Reactions   Pollen Extract Cough, Itching and Shortness Of Breath   Dust Mite Extract Itching   Latex     Other reaction(s): itching   Tree Extract Itching    ROS Review of Systems  Constitutional: Negative for chills and fever.  HENT: Positive for congestion.   Respiratory: Negative for cough and shortness of breath.   Cardiovascular: Negative for chest pain.  Musculoskeletal: Positive for arthralgias.      Objective:    Physical Exam Vitals reviewed.  Constitutional:      Appearance: Normal appearance.  Cardiovascular:     Rate and Rhythm: Normal rate and regular rhythm.  Pulmonary:     Effort: Pulmonary effort is normal.     Breath sounds: Normal breath sounds. No wheezing or rales.  Musculoskeletal:     Cervical back: Neck supple.     Comments: Minimal nodular thickening DIP and PIP joints of hand.    Lymphadenopathy:     Cervical: No cervical adenopathy.  Neurological:     Mental Status: She is alert.     BP 118/76    Pulse 61    Ht 5\' 2"  (1.575 m)    Wt 148 lb (67.1 kg)    SpO2 97%    BMI 27.07 kg/m  Wt Readings from Last 3 Encounters:  10/19/20 148 lb (67.1 kg)  06/08/20 147 lb 12.8 oz (67 kg)  03/28/20 150 lb 3.2 oz (68.1 kg)     Health Maintenance Due  Topic Date Due   Hepatitis C Screening  Never done   HIV Screening  Never done   TETANUS/TDAP  Never done   COLONOSCOPY (Pts 45-38yrs Insurance coverage will need to be confirmed)  Never done    There are no preventive care reminders to display for this patient.  Lab Results  Component Value Date   TSH 2.34 11/01/2019   Lab Results  Component Value Date   WBC 7.4 11/01/2019   HGB 14.2 11/01/2019   HCT 42.7 11/01/2019   MCV 93.4 11/01/2019   PLT 296.0 11/01/2019   Lab Results  Component Value Date   NA 141 11/01/2019   K 4.9 11/01/2019   CO2 28  11/01/2019   GLUCOSE 85 11/01/2019   BUN 16 11/01/2019   CREATININE 0.91 11/01/2019   BILITOT 0.4 11/01/2019  ALKPHOS 64 11/01/2019   AST 21 11/01/2019   ALT 19 11/01/2019   PROT 6.8 11/01/2019   ALBUMIN 4.5 11/01/2019   CALCIUM 9.8 11/01/2019   GFR 64.39 11/01/2019   Lab Results  Component Value Date   CHOL 242 (H) 11/01/2019   Lab Results  Component Value Date   HDL 64.40 11/01/2019   Lab Results  Component Value Date   LDLCALC 160 (H) 11/01/2019   Lab Results  Component Value Date   TRIG 86.0 11/01/2019   Lab Results  Component Value Date   CHOLHDL 4 11/01/2019   No results found for: HGBA1C    Assessment & Plan:   #1 bilateral hand pain.  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  -Continue Tylenol as needed -May supplement with Voltaren gel as needed  #2 history of chronic anxiety currently stable on Celexa 20 mg daily -Refilled Celexa for 1 year  #3 allergic rhinitis -Refill Singulair 10 mg daily for 1 year  #4 concern for possible ADD.  She was given name of our behavioral health division for someone who might be able to do adult ADD testing.  She will consider checking into this.  Meds ordered this encounter  Medications   citalopram (CELEXA) 20 MG tablet    Sig: Take 1 tablet (20 mg total) by mouth daily.    Dispense:  90 tablet    Refill:  3   montelukast (SINGULAIR) 10 MG tablet    Sig: Take 1 tablet (10 mg total) by mouth at bedtime.    Dispense:  90 tablet    Refill:  3    Follow-up: No follow-ups on file.    Carolann Littler, MD

## 2020-10-21 ENCOUNTER — Other Ambulatory Visit: Payer: Self-pay | Admitting: Family Medicine

## 2020-12-31 ENCOUNTER — Other Ambulatory Visit: Payer: Self-pay

## 2021-03-07 ENCOUNTER — Encounter: Payer: Self-pay | Admitting: Family Medicine

## 2021-03-28 ENCOUNTER — Encounter: Payer: Self-pay | Admitting: Obstetrics & Gynecology

## 2021-03-28 ENCOUNTER — Other Ambulatory Visit: Payer: Self-pay

## 2021-03-28 ENCOUNTER — Ambulatory Visit (INDEPENDENT_AMBULATORY_CARE_PROVIDER_SITE_OTHER): Payer: BC Managed Care – PPO | Admitting: Obstetrics & Gynecology

## 2021-03-28 VITALS — BP 118/74 | HR 63 | Resp 16 | Ht 61.25 in | Wt 150.0 lb

## 2021-03-28 DIAGNOSIS — E663 Overweight: Secondary | ICD-10-CM

## 2021-03-28 DIAGNOSIS — Z01419 Encounter for gynecological examination (general) (routine) without abnormal findings: Secondary | ICD-10-CM

## 2021-03-28 DIAGNOSIS — Z79890 Hormone replacement therapy (postmenopausal): Secondary | ICD-10-CM

## 2021-03-28 MED ORDER — PROGESTERONE MICRONIZED 100 MG PO CAPS: 100.0000 mg | ORAL_CAPSULE | Freq: Every day | ORAL | 4 refills | Status: DC

## 2021-03-28 MED ORDER — ESTRADIOL 0.075 MG/24HR TD PTTW: 1.0000 | MEDICATED_PATCH | TRANSDERMAL | 4 refills | Status: DC

## 2021-03-28 NOTE — Progress Notes (Addendum)
Shelly Combs 1966-04-30 [id number]   History:    55 y.o.  G0 Married.  Adopted son is 74 yo.  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   RP:  Established patient presenting for annual gyn exam   HPI: Postmenopausal on HRT with Estradiol patch 0.05 twice a week and Prometrium 100 mg HS.  No PMB.  Hot flushes and night sweats increased again x 1 month.  Feels surly.  No major depression Sxs on Celexa.  Still grieving.  No pelvic pain.  No pain with IC.  Urine/BMs normal.  Breasts normal.  BMI 28.11.  Regular fitness activities.  Health Labs with [facility] 5 yrs ago.      Past medical history,surgical history, family history and social history were all reviewed and documented in the EPIC chart.  Gynecologic History Patient's last menstrual period was 12/31/2017.  Obstetric History OB History  Gravida Para Term Preterm AB Living  0 0 0 0 0 0  SAB IAB Ectopic Multiple Live Births  0 0 0 0 0     ROS: A ROS was performed and pertinent positives and negatives are included in the history.  GENERAL: No fevers or chills. HEENT: No change in vision, no earache, sore throat or sinus congestion. NECK: No pain or stiffness. CARDIOVASCULAR: No chest pain or pressure. No palpitations. PULMONARY: No shortness of breath, cough or wheeze. GASTROINTESTINAL: No abdominal pain, nausea, vomiting or diarrhea, melena or bright red blood per rectum. GENITOURINARY: No urinary frequency, urgency, hesitancy or dysuria. MUSCULOSKELETAL: No joint or muscle pain, no back pain, no recent trauma. DERMATOLOGIC: No rash, no itching, no lesions. ENDOCRINE: No polyuria, polydipsia, no heat or cold intolerance. No recent change in weight. HEMATOLOGICAL: No anemia or easy bruising or bleeding. NEUROLOGIC: No headache, seizures, numbness, tingling or weakness. PSYCHIATRIC: No depression, no loss of interest in normal activity or change in sleep pattern.     Exam:   BP 118/74   Pulse 63   Resp 16   Ht 5' 1.25"  (1.556 m)   Wt 150 lb (68 kg)   LMP 12/31/2017   BMI 28.11 kg/m   Body mass index is 28.11 kg/m.  General appearance : Well developed well nourished female. No acute distress HEENT: Eyes: no retinal hemorrhage or exudates,  Neck supple, trachea midline, no carotid bruits, no thyroidmegaly Lungs: Clear to auscultation, no rhonchi or wheezes, or rib retractions  Heart: Regular rate and rhythm, no murmurs or gallops Breast:Examined in sitting and supine position were symmetrical in appearance, no palpable masses or tenderness,  no skin retraction, no nipple inversion, no nipple discharge, no skin discoloration, no axillary or supraclavicular lymphadenopathy Abdomen: no palpable masses or tenderness, no rebound or guarding Extremities: no edema or skin discoloration or tenderness  Pelvic: Vulva: Normal             Vagina: No gross lesions or discharge  Cervix: No gross lesions or discharge  Uterus  AV, normal size, shape and consistency, non-tender and mobile  Adnexa  Without masses or tenderness  Anus: Normal   Assessment/Plan:  54 y.o. female for annual exam   1. Well female exam with routine gynecological exam Normal gynecologic exam.  Pap test negative in 2020, will repeat at 3 years.  Breast exam normal.  Screening mammogram at [facility] in 2021 will obtain report.  Colonoscopy done 5 years ago.  Health labs with family physician.  2. Postmenopausal hormone replacement therapy Postmenopausal with a recent  increase in menopausal symptoms including hot flashes, night sweat and moments when she feels surely.  No contraindication to continue on hormone replacement therapy.  Decision to increase the Estradiol patch to 0.075 twice weekly.  Continue Prometrium 100 mg HS. prescription sent to pharmacy.  Vitamin D supplements, calcium intake of 1.5 g/day total and regular weightbearing physical activity is recommended.  3. Overweight (BMI 25.0-29.9) Recommend a slightly lower calorie/carb  diet.  Aerobic activities 5 times a week and light weightlifting every 2 days.  Other orders - estradiol (VIVELLE-DOT) 0.075 MG/24HR; Place 1 patch onto the skin 2 (two) times a week. - progesterone (PROMETRIUM) 100 MG capsule; Take 1 capsule (100 mg total) by mouth at bedtime.   Princess Bruins MD, 1:40 PM 03/28/2021

## 2021-03-30 ENCOUNTER — Encounter: Payer: Self-pay | Admitting: Obstetrics & Gynecology

## 2021-04-02 ENCOUNTER — Other Ambulatory Visit: Payer: Self-pay

## 2021-04-03 ENCOUNTER — Ambulatory Visit: Payer: BC Managed Care – PPO | Admitting: Family Medicine

## 2021-04-03 VITALS — BP 128/84 | HR 80 | Temp 98.1°F | Ht 61.25 in | Wt 150.4 lb

## 2021-04-03 DIAGNOSIS — E785 Hyperlipidemia, unspecified: Secondary | ICD-10-CM

## 2021-04-03 DIAGNOSIS — R21 Rash and other nonspecific skin eruption: Secondary | ICD-10-CM | POA: Diagnosis not present

## 2021-04-03 LAB — HEPATIC FUNCTION PANEL
ALT: 18 U/L (ref 0–35)
AST: 18 U/L (ref 0–37)
Albumin: 4.1 g/dL (ref 3.5–5.2)
Alkaline Phosphatase: 63 U/L (ref 39–117)
Bilirubin, Direct: 0.1 mg/dL (ref 0.0–0.3)
Total Bilirubin: 0.5 mg/dL (ref 0.2–1.2)
Total Protein: 6.3 g/dL (ref 6.0–8.3)

## 2021-04-03 LAB — BASIC METABOLIC PANEL
BUN: 15 mg/dL (ref 6–23)
CO2: 27 mEq/L (ref 19–32)
Calcium: 9 mg/dL (ref 8.4–10.5)
Chloride: 107 mEq/L (ref 96–112)
Creatinine, Ser: 0.84 mg/dL (ref 0.40–1.20)
GFR: 78.15 mL/min (ref >60.00)
Glucose, Bld: 92 mg/dL (ref 70–99)
Potassium: 4.2 mEq/L (ref 3.5–5.1)
Sodium: 140 mEq/L (ref 135–145)

## 2021-04-03 LAB — LIPID PANEL
Cholesterol: 239 mg/dL — ABNORMAL HIGH (ref 0–200)
HDL: 68.4 mg/dL (ref >39.00)
LDL Cholesterol: 155 mg/dL — ABNORMAL HIGH (ref 0–99)
NonHDL: 170.1
Total CHOL/HDL Ratio: 3
Triglycerides: 76 mg/dL (ref 0.0–149.0)
VLDL: 15.2 mg/dL (ref 0.0–40.0)

## 2021-04-03 NOTE — Patient Instructions (Signed)
Avoid any red meats until alpha gal allergy clarified.

## 2021-04-03 NOTE — Progress Notes (Signed)
Established Patient Office Visit  Subjective:  Patient ID: Shelly Combs, female    DOB: [birth year]  Age: 55 y.o. MRN: [id number]  CC:  Chief Complaint  Patient presents with   Follow-up    HPI Shelly Combs presents for the following issues  She states for the past year she has had some intermittent episodes of diffuse pruritus and "prickly" sensation involving her trunk and upper and lower extremities.  She has made the observation that this tends to occur about 8 hours after consuming red meats.  Her most recent episode was yesterday.  She does relate tick bite last summer left lower abdomen.  She is not sure what type of tick.  She specifically has concerns about alpha gal allergy.  She went through interval earlier this year where she avoided red meats of any kind and during that stretch did not have any of those rashes.  She has not had full anaphylaxis symptoms such as lip or tongue swelling or any hand or feet swelling.  She states she has diffuse pruritus but has not really seen any clear hives.  She does not describe any fever, myalgias, headache, arthralgias, or erythema migrans rash after her tick bite last summer.  Hyperlipidemia history.  No family history of premature CAD.  She is requesting repeat lipids today.  She has no history of hypertension.  No history of diabetes.  Non-smoker.  10-year risk for CAD based on last year's results would be only 2.1%.  Very stressful year.  She states she had a brother-in-law that committed suicide earlier this year and that naturally has been very difficult for the entire family.  The 10-year ASCVD risk score [name] DC Jr., et al., 2013) is: 2.1%   Values used to calculate the score:     Age: 80 years     Sex: Female     Is Non-Hispanic African American: No     Diabetic: No     Tobacco smoker: No     Systolic Blood Pressure: 0000000 mmHg     Is BP treated: No     HDL Cholesterol: 64.4 mg/dL     Total Cholesterol: 242 mg/dL   Past  Medical History:  Diagnosis Date   Allergy    Asthma    Cardiac arrhythmia due to congenital heart disease    Chronic back pain    Uterine fibroid     Past Surgical History:  Procedure Laterality Date   GANGLION CYST EXCISION  1980   HYSTEROSCOPY  1999   uterine fibroids    Family History  Problem Relation Age of Onset   Arthritis Other    Cancer Other        colon   Heart disease Other    Mental illness Other    Cancer Paternal Grandfather        colon   Atrial fibrillation Mother     Social History   Socioeconomic History   Marital status: Married    Spouse name: Not on file   Number of children: Not on file   Years of education: Not on file   Highest education level: Not on file  Occupational History   Not on file  Tobacco Use   Smoking status: Never   Smokeless tobacco: Never  Vaping Use   Vaping Use: Never used  Substance and Sexual Activity   Alcohol use: Yes    Alcohol/week: 2.0 standard drinks    Types: 2 Glasses of wine per  week    Comment: 2 glasses of wine a night    Drug use: No   Sexual activity: Yes    Partners: Male    Birth control/protection: Post-menopausal    Comment: 1st intercourse- 46, partners- 40, married- 81 yrs   Other Topics Concern   Not on file  Social History Narrative   Not on file   Social Determinants of Health   Financial Resource Strain: Not on file  Food Insecurity: Not on file  Transportation Needs: Not on file  Physical Activity: Not on file  Stress: Not on file  Social Connections: Not on file  Intimate Partner Violence: Not on file    Outpatient Medications Prior to Visit  Medication Sig Dispense Refill   citalopram (CELEXA) 20 MG tablet Take 1 tablet (20 mg total) by mouth daily. 90 tablet 3   estradiol (VIVELLE-DOT) 0.075 MG/24HR Place 1 patch onto the skin 2 (two) times a week. 24 patch 4   montelukast (SINGULAIR) 10 MG tablet Take 1 tablet (10 mg total) by mouth at bedtime. 90 tablet 3   PROAIR HFA  108 (90 BASE) MCG/ACT inhaler Use 2 puffs every 4 hours as needed 17 g 3   progesterone (PROMETRIUM) 100 MG capsule Take 1 capsule (100 mg total) by mouth at bedtime. 90 capsule 4   No facility-administered medications prior to visit.    Allergies  Allergen Reactions   Pollen Extract Cough, Itching and Shortness Of Breath   Dust Mite Extract Itching   Latex     Other reaction(s): itching   Tree Extract Itching    ROS Review of Systems  Constitutional:  Negative for appetite change, chills, fever and unexpected weight change.  Eyes:  Negative for visual disturbance.  Respiratory:  Negative for cough and shortness of breath.   Skin:  Positive for rash.  Hematological:  Negative for adenopathy.     Objective:    Physical Exam Vitals reviewed.  Constitutional:      Appearance: Normal appearance.  Cardiovascular:     Rate and Rhythm: Normal rate and regular rhythm.  Pulmonary:     Effort: Pulmonary effort is normal.     Breath sounds: Normal breath sounds. No wheezing or rales.  Skin:    Findings: No rash.  Neurological:     Mental Status: She is alert.    BP 128/84   Pulse 80   Temp 98.1 F (36.7 C) (Oral)   Ht 5' 1.25" (1.556 m)   Wt 150 lb 6.4 oz (68.2 kg)   LMP 12/31/2017   SpO2 97%   BMI 28.19 kg/m  Wt Readings from Last 3 Encounters:  04/03/21 150 lb 6.4 oz (68.2 kg)  03/28/21 150 lb (68 kg)  10/19/20 148 lb (67.1 kg)     Health Maintenance Due  Topic Date Due   HIV Screening  Never done   Hepatitis C Screening  Never done   TETANUS/TDAP  Never done   COLONOSCOPY (Pts 45-42yr Insurance coverage will need to be confirmed)  Never done   Zoster Vaccines- Shingrix (1 of 2) Never done    There are no preventive care reminders to display for this patient.  Lab Results  Component Value Date   TSH 2.34 11/01/2019   Lab Results  Component Value Date   WBC 7.4 11/01/2019   HGB 14.2 11/01/2019   HCT 42.7 11/01/2019   MCV 93.4 11/01/2019   PLT  296.0 11/01/2019   Lab Results  Component Value Date  NA 141 11/01/2019   K 4.9 11/01/2019   CO2 28 11/01/2019   GLUCOSE 85 11/01/2019   BUN 16 11/01/2019   CREATININE 0.91 11/01/2019   BILITOT 0.4 11/01/2019   ALKPHOS 64 11/01/2019   AST 21 11/01/2019   ALT 19 11/01/2019   PROT 6.8 11/01/2019   ALBUMIN 4.5 11/01/2019   CALCIUM 9.8 11/01/2019   GFR 64.39 11/01/2019   Lab Results  Component Value Date   CHOL 242 (H) 11/01/2019   Lab Results  Component Value Date   HDL 64.40 11/01/2019   Lab Results  Component Value Date   LDLCALC 160 (H) 11/01/2019   Lab Results  Component Value Date   TRIG 86.0 11/01/2019   Lab Results  Component Value Date   CHOLHDL 4 11/01/2019   No results found for: HGBA1C    Assessment & Plan:   #1 intermittent rash and pruritus with observation that this occurs consistently after consuming red meat after several hours.  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  Overall, low risk for CAD. -Patient requesting repeat fasting lipid panel and this will be obtained today -Continue low saturated fat diet   No orders of the defined types were placed in this encounter.   Follow-up: No follow-ups on file.    Carolann Littler, MD

## 2021-04-03 NOTE — Progress Notes (Signed)
Pre visit review using our clinic review tool, if applicable. No additional management support is needed unless otherwise documented below in the visit note. 

## 2021-04-08 LAB — ALPHA-GAL PANEL
Beef IgE: <0.1 kU/L (ref <0.35)
Class: 0
Class: 0
Class: 0
Galactose-alpha-1,3-galactose IgE: <0.1 kU/L (ref <0.10)
LAMB/MUTTON IGE: <0.1 kU/L (ref <0.35)
Pork IgE: <0.1 kU/L (ref <0.35)

## 2021-05-31 ENCOUNTER — Telehealth: Payer: BC Managed Care – PPO | Admitting: Family

## 2021-05-31 DIAGNOSIS — J4530 Mild persistent asthma, uncomplicated: Secondary | ICD-10-CM

## 2021-05-31 DIAGNOSIS — J208 Acute bronchitis due to other specified organisms: Secondary | ICD-10-CM | POA: Diagnosis not present

## 2021-05-31 DIAGNOSIS — B9689 Other specified bacterial agents as the cause of diseases classified elsewhere: Secondary | ICD-10-CM

## 2021-05-31 MED ORDER — ALBUTEROL SULFATE HFA 108 (90 BASE) MCG/ACT IN AERS: 1.0000 | INHALATION_SPRAY | Freq: Four times a day (QID) | RESPIRATORY_TRACT | 1 refills | Status: DC | PRN

## 2021-05-31 MED ORDER — PREDNISONE 10 MG (21) PO TBPK: ORAL_TABLET | ORAL | 0 refills | Status: DC

## 2021-05-31 MED ORDER — AZITHROMYCIN 250 MG PO TABS: ORAL_TABLET | ORAL | 0 refills | Status: DC

## 2021-05-31 NOTE — Progress Notes (Signed)
Virtual Visit Consent   TYSHAUNA FINKBINER, you are scheduled for a virtual visit with a [facility] provider today.     Just as with appointments in the office, your consent must be obtained to participate.  Your consent will be active for this visit and any virtual visit you may have with one of our providers in the next 365 days.     If you have a MyChart account, a copy of this consent can be sent to you electronically.  All virtual visits are billed to your insurance company just like a traditional visit in the office.    As this is a virtual visit, video technology does not allow for your provider to perform a traditional examination.  This may limit your provider's ability to fully assess your condition.  If your provider identifies any concerns that need to be evaluated in person or the need to arrange testing (such as labs, EKG, etc.), we will make arrangements to do so.     Although advances in technology are sophisticated, we cannot ensure that it will always work on either your end or our end.  If the connection with a video visit is poor, the visit may have to be switched to a telephone visit.  With either a video or telephone visit, we are not always able to ensure that we have a secure connection.     I need to obtain your verbal consent now.   Are you willing to proceed with your visit today?    ALEXYS LOBELLO has provided verbal consent on 05/31/2021 for a virtual visit (video or telephone).   Evelina Dun, FNP   Date: 05/31/2021 9:34 AM   Virtual Visit via Video Note   I, Evelina Dun, connected with  HARTLEE AMEDEE  ([id number], 08/23/1966) on 05/31/21 at  9:30 AM EDT by a video-enabled telemedicine application and verified that I am speaking with the correct person using two identifiers.  Location: Patient: Virtual Visit Location Patient: Home Provider: Virtual Visit Location Provider: Home   I discussed the limitations of evaluation and management by telemedicine and the  availability of in person appointments. The patient expressed understanding and agreed to proceed.    History of Present Illness: Shelly Combs is a 55 y.o. who identifies as a female who was assigned female at birth, and is being seen today for cough, congestion for 13 days. She has asthma. She had a negative COVID test.   HPI: Cough This is a new problem. The current episode started 1 to 4 weeks ago. The problem has been gradually worsening. The problem occurs every few minutes. The cough is Productive of sputum. Associated symptoms include ear congestion, ear pain (left), headaches, myalgias, nasal congestion, postnasal drip, a sore throat and shortness of breath (tight). Pertinent negatives include no chills (improved), fever or wheezing. The symptoms are aggravated by lying down. She has tried rest (QVAR) for the symptoms. The treatment provided mild relief.   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 07/01/2016   Performance anxiety 04/23/2014   Asthma 03/26/2011   Allergic rhinitis 03/26/2011   Allergic conjunctivitis 03/26/2011  Anxiety 03/26/2011    Allergies:  Allergies  Allergen Reactions   Pollen Extract Cough, Itching and Shortness Of Breath   Dust Mite Extract Itching   Latex     Other reaction(s): itching   Tree Extract Itching   Medications:  Current Outpatient Medications:    azithromycin (ZITHROMAX) 250 MG tablet, Take 500 mg once, then 250 mg for four days, Disp: 6 tablet, Rfl: 0   predniSONE (STERAPRED UNI-PAK 21 TAB) 10 MG (21) TBPK tablet, Use as directed, Disp: 21 tablet, Rfl: 0    citalopram (CELEXA) 20 MG tablet, Take 1 tablet (20 mg total) by mouth daily., Disp: 90 tablet, Rfl: 3   estradiol (VIVELLE-DOT) 0.075 MG/24HR, Place 1 patch onto the skin 2 (two) times a week., Disp: 24 patch, Rfl: 4   montelukast (SINGULAIR) 10 MG tablet, Take 1 tablet (10 mg total) by mouth at bedtime., Disp: 90 tablet, Rfl: 3   PROAIR HFA 108 (90 BASE) MCG/ACT inhaler, Use 2 puffs every 4 hours as needed, Disp: 17 g, Rfl: 3   progesterone (PROMETRIUM) 100 MG capsule, Take 1 capsule (100 mg total) by mouth at bedtime., Disp: 90 capsule, Rfl: 4  Observations/Objective: Patient is well-developed, well-nourished in no acute distress.  Resting comfortably  at home.  Head is normocephalic, atraumatic.  No labored breathing.  Speech is clear and coherent with logical content.  Patient is alert and oriented at baseline.  Nasal congestion   Assessment and Plan: 1. Mild persistent asthma without complication - azithromycin (ZITHROMAX) 250 MG tablet; Take 500 mg once, then 250 mg for four days  Dispense: 6 tablet; Refill: 0 - predniSONE (STERAPRED UNI-PAK 21 TAB) 10 MG (21) TBPK tablet; Use as directed  Dispense: 21 tablet; Refill: 0  2. Acute bacterial bronchitis - azithromycin (ZITHROMAX) 250 MG tablet; Take 500 mg once, then 250 mg for four days  Dispense: 6 tablet; Refill: 0 - predniSONE (STERAPRED UNI-PAK 21 TAB) 10 MG (21) TBPK tablet; Use as directed  Dispense: 21 tablet; Refill: 0 Force fluids Tylenol  Continue current medications Follow up if symptoms worsen or do not improve   Follow Up Instructions: I discussed the assessment and treatment plan with the patient. The patient was provided an opportunity to ask questions and all were answered. The patient agreed with the plan and demonstrated an understanding of the instructions.  A copy of instructions were sent to the patient via MyChart unless otherwise noted below.     The patient was advised to call back or seek an in-person  evaluation if the symptoms worsen or if the condition fails to improve as anticipated.  Time:  I spent 8 minutes with the patient via telehealth technology discussing the above problems/concerns.    Evelina Dun, FNP

## 2021-06-12 LAB — HM MAMMOGRAPHY

## 2021-06-13 ENCOUNTER — Encounter: Payer: Self-pay | Admitting: Family Medicine

## 2021-06-18 ENCOUNTER — Other Ambulatory Visit: Payer: Self-pay

## 2021-06-18 ENCOUNTER — Ambulatory Visit (INDEPENDENT_AMBULATORY_CARE_PROVIDER_SITE_OTHER): Payer: BC Managed Care – PPO

## 2021-06-18 ENCOUNTER — Ambulatory Visit: Payer: BC Managed Care – PPO | Admitting: Family Medicine

## 2021-06-18 VITALS — BP 124/68 | HR 75 | Temp 97.8°F | Wt 151.0 lb

## 2021-06-18 DIAGNOSIS — R059 Cough, unspecified: Secondary | ICD-10-CM | POA: Diagnosis not present

## 2021-06-18 NOTE — Progress Notes (Signed)
Established Patient Office Visit  Subjective:  Patient ID: Shelly Combs, female    DOB: 11-Oct-1965  Age: 55 y.o. MRN: [id number]  CC:  Chief Complaint  Patient presents with   Cough    X 1 month, productive cough, congestion, sinus pressure, ear pressure, covid negative    HPI Shelly Combs presents for persistent cough for the past month.  Home COVID test negative.  Cough sometimes productive.  She had some intermittent sinus pressure.  She did virtual visit and was treated with Zithromax and prednisone but did not see much change in her cough.  She uses albuterol as needed.  Some bilateral ear pressure.  No facial pain.  No fever.  No hemoptysis.  Has never smoked.  She does have some general malaise.  Has been taking frequent naps of the past several weeks.  Past Medical History:  Diagnosis Date   Allergy    Asthma    Cardiac arrhythmia due to congenital heart disease    Chronic back pain    Uterine fibroid     Past Surgical History:  Procedure Laterality Date   GANGLION CYST EXCISION  1980   HYSTEROSCOPY  1999   uterine fibroids    Family History  Problem Relation Age of Onset   Arthritis Other    Cancer Other        colon   Heart disease Other    Mental illness Other    Cancer Paternal Grandfather        colon   Atrial fibrillation Mother     Social History   Socioeconomic History   Marital status: Married    Spouse name: Not on file   Number of children: Not on file   Years of education: Not on file   Highest education level: Not on file  Occupational History   Not on file  Tobacco Use   Smoking status: Never   Smokeless tobacco: Never  Vaping Use   Vaping Use: Never used  Substance and Sexual Activity   Alcohol use: Yes    Alcohol/week: 2.0 standard drinks    Types: 2 Glasses of wine per week    Comment: 2 glasses of wine a night    Drug use: No   Sexual activity: Yes    Partners: Male    Birth control/protection: Post-menopausal     Comment: 1st intercourse- 17, partners- 36, married- 24 yrs   Other Topics Concern   Not on file  Social History Narrative   Not on file   Social Determinants of Health   Financial Resource Strain: Not on file  Food Insecurity: Not on file  Transportation Needs: Not on file  Physical Activity: Not on file  Stress: Not on file  Social Connections: Not on file  Intimate Partner Violence: Not on file    Outpatient Medications Prior to Visit  Medication Sig Dispense Refill   albuterol (PROAIR HFA) 108 (90 Base) MCG/ACT inhaler Inhale 1 puff into the lungs every 6 (six) hours as needed for wheezing or shortness of breath. 17 g 1   citalopram (CELEXA) 20 MG tablet Take 1 tablet (20 mg total) by mouth daily. 90 tablet 3   estradiol (VIVELLE-DOT) 0.075 MG/24HR Place 1 patch onto the skin 2 (two) times a week. 24 patch 4   montelukast (SINGULAIR) 10 MG tablet Take 1 tablet (10 mg total) by mouth at bedtime. 90 tablet 3   predniSONE (STERAPRED UNI-PAK 21 TAB) 10 MG (21) TBPK tablet  Use as directed 21 tablet 0   progesterone (PROMETRIUM) 100 MG capsule Take 1 capsule (100 mg total) by mouth at bedtime. 90 capsule 4   azithromycin (ZITHROMAX) 250 MG tablet Take 500 mg once, then 250 mg for four days 6 tablet 0   No facility-administered medications prior to visit.    Allergies  Allergen Reactions   Pollen Extract Cough, Itching and Shortness Of Breath   Dust Mite Extract Itching   Latex     Other reaction(s): itching   Tree Extract Itching    ROS Review of Systems  Constitutional:  Negative for chills and fever.  Respiratory:  Positive for cough. Negative for shortness of breath and wheezing.   Cardiovascular:  Negative for chest pain, palpitations and leg swelling.     Objective:    Physical Exam Vitals reviewed.  Constitutional:      Appearance: Normal appearance.  HENT:     Right Ear: Tympanic membrane and ear canal normal.     Left Ear: Tympanic membrane and ear canal  normal.  Cardiovascular:     Rate and Rhythm: Normal rate and regular rhythm.  Pulmonary:     Effort: Pulmonary effort is normal.     Breath sounds: Normal breath sounds. No wheezing or rales.  Musculoskeletal:     Cervical back: Neck supple.     Right lower leg: No edema.     Left lower leg: No edema.  Lymphadenopathy:     Cervical: No cervical adenopathy.  Neurological:     Mental Status: She is alert.    BP 124/68 (BP Location: Left Arm, Patient Position: Sitting, Cuff Size: Normal)   Pulse 75   Temp 97.8 F (36.6 C) (Oral)   Wt 151 lb (68.5 kg)   LMP 12/31/2017   SpO2 100%   BMI 28.30 kg/m  Wt Readings from Last 3 Encounters:  06/18/21 151 lb (68.5 kg)  04/03/21 150 lb 6.4 oz (68.2 kg)  03/28/21 150 lb (68 kg)     Health Maintenance Due  Topic Date Due   HIV Screening  Never done   Hepatitis C Screening  Never done   TETANUS/TDAP  Never done   COLONOSCOPY (Pts 45-34yrs Insurance coverage will need to be confirmed)  Never done   Zoster Vaccines- Shingrix (1 of 2) Never done   INFLUENZA VACCINE  04/08/2021    There are no preventive care reminders to display for this patient.  Lab Results  Component Value Date   TSH 2.34 11/01/2019   Lab Results  Component Value Date   WBC 7.4 11/01/2019   HGB 14.2 11/01/2019   HCT 42.7 11/01/2019   MCV 93.4 11/01/2019   PLT 296.0 11/01/2019   Lab Results  Component Value Date   NA 140 04/03/2021   K 4.2 04/03/2021   CO2 27 04/03/2021   GLUCOSE 92 04/03/2021   BUN 15 04/03/2021   CREATININE 0.84 04/03/2021   BILITOT 0.5 04/03/2021   ALKPHOS 63 04/03/2021   AST 18 04/03/2021   ALT 18 04/03/2021   PROT 6.3 04/03/2021   ALBUMIN 4.1 04/03/2021   CALCIUM 9.0 04/03/2021   GFR 78.15 04/03/2021   Lab Results  Component Value Date   CHOL 239 (H) 04/03/2021   Lab Results  Component Value Date   HDL 68.40 04/03/2021   Lab Results  Component Value Date   LDLCALC 155 (H) 04/03/2021   Lab Results  Component  Value Date   TRIG 76.0 04/03/2021   Lab  Results  Component Value Date   CHOLHDL 3 04/03/2021   No results found for: HGBA1C    Assessment & Plan:   Problem List Items Addressed This Visit   None Visit Diagnoses     Cough, unspecified type    -  Primary   Relevant Orders   DG Chest 2 View     Patient relates 1 month history of cough.  Suspect postviral.  COVID testing negative.  Non-smoker.  No red flags such as fever, dyspnea, hemoptysis, etc.  She does have some nonspecific increased malaise.  Given duration of symptoms go ahead with PA and lateral chest x-ray  -If normal recommend over-the-counter antihistamine such as Xyzal 5 mg daily for allergy symptoms  No orders of the defined types were placed in this encounter.   Follow-up: No follow-ups on file.    Carolann Littler, MD

## 2021-07-12 ENCOUNTER — Telehealth: Payer: Self-pay

## 2021-07-12 ENCOUNTER — Other Ambulatory Visit: Payer: Self-pay

## 2021-07-12 ENCOUNTER — Emergency Department (HOSPITAL_COMMUNITY)
Admission: EM | Admit: 2021-07-12 | Discharge: 2021-07-13 | Disposition: A | Discharge status: Home / Self Care | Payer: BC Managed Care – PPO | Attending: Emergency Medicine | Admitting: Emergency Medicine

## 2021-07-12 ENCOUNTER — Ambulatory Visit (HOSPITAL_COMMUNITY)
Admission: EM | Admit: 2021-07-12 | Discharge: 2021-07-12 | Disposition: A | Discharge status: Home / Self Care | Payer: BC Managed Care – PPO

## 2021-07-12 ENCOUNTER — Encounter (HOSPITAL_COMMUNITY): Payer: Self-pay

## 2021-07-12 DIAGNOSIS — R059 Cough, unspecified: Secondary | ICD-10-CM | POA: Insufficient documentation

## 2021-07-12 DIAGNOSIS — R0789 Other chest pain: Secondary | ICD-10-CM | POA: Diagnosis present

## 2021-07-12 DIAGNOSIS — R079 Chest pain, unspecified: Secondary | ICD-10-CM

## 2021-07-12 DIAGNOSIS — J45909 Unspecified asthma, uncomplicated: Secondary | ICD-10-CM | POA: Insufficient documentation

## 2021-07-12 DIAGNOSIS — Z91040 Latex allergy status: Secondary | ICD-10-CM | POA: Insufficient documentation

## 2021-07-12 LAB — CBC WITH DIFFERENTIAL/PLATELET
Abs Immature Granulocytes: 0.03 10*3/uL (ref 0.00–0.07)
Basophils Absolute: 0.1 10*3/uL (ref 0.0–0.1)
Basophils Relative: 1 %
Eosinophils Absolute: 0.1 10*3/uL (ref 0.0–0.5)
Eosinophils Relative: 2 %
HCT: 42 % (ref 36.0–46.0)
Hemoglobin: 13.7 g/dL (ref 12.0–15.0)
Immature Granulocytes: 0 %
Lymphocytes Relative: 28 %
Lymphs Abs: 2.1 10*3/uL (ref 0.7–4.0)
MCH: 31 pg (ref 26.0–34.0)
MCHC: 32.6 g/dL (ref 30.0–36.0)
MCV: 95 fL (ref 80.0–100.0)
Monocytes Absolute: 0.8 10*3/uL (ref 0.1–1.0)
Monocytes Relative: 11 %
Neutro Abs: 4.3 10*3/uL (ref 1.7–7.7)
Neutrophils Relative %: 58 %
Platelets: 275 10*3/uL (ref 150–400)
RBC: 4.42 MIL/uL (ref 3.87–5.11)
RDW: 11.6 % (ref 11.5–15.5)
WBC: 7.4 10*3/uL (ref 4.0–10.5)
nRBC: 0 % (ref 0.0–0.2)

## 2021-07-12 LAB — COMPREHENSIVE METABOLIC PANEL
ALT: 24 U/L (ref 0–44)
AST: 23 U/L (ref 15–41)
Albumin: 3.9 g/dL (ref 3.5–5.0)
Alkaline Phosphatase: 58 U/L (ref 38–126)
Anion gap: 7 (ref 5–15)
BUN: 13 mg/dL (ref 6–20)
CO2: 24 mmol/L (ref 22–32)
Calcium: 9.1 mg/dL (ref 8.9–10.3)
Chloride: 109 mmol/L (ref 98–111)
Creatinine, Ser: 0.87 mg/dL (ref 0.44–1.00)
GFR, Estimated: >60 mL/min (ref >60)
Glucose, Bld: 95 mg/dL (ref 70–99)
Potassium: 4.1 mmol/L (ref 3.5–5.1)
Sodium: 140 mmol/L (ref 135–145)
Total Bilirubin: 0.5 mg/dL (ref 0.3–1.2)
Total Protein: 6.4 g/dL — ABNORMAL LOW (ref 6.5–8.1)

## 2021-07-12 LAB — TROPONIN I (HIGH SENSITIVITY)
Troponin I (High Sensitivity): <2 ng/L (ref <18)
Troponin I (High Sensitivity): 2 ng/L (ref <18)

## 2021-07-12 NOTE — Telephone Encounter (Signed)
Caller states has had sharp pains in the heart area. They come and go. Sharp pain in left breast below woke up. Hx of POTS. Just happened again while sitting outside, last few seconds. Some SOB, had resp. virus dx September, antibiotics. CXR clear, wk ago. No fever. Lots of stress now.  07/12/2021 2:23:44 PM Go to ED Now Yes Alvis Lemmings, RN, [place] Understands Yes  Referrals [facility] - ED  07/12/21 1635:  No appt/ED visit noted in chart. Pt called. Pt states she has not gone to ED b/c she was trying to take a nap. She stated she failed to mention at earlier call that she was carrying a heavy back pack yesterday & feels the pain may be attributed to that. She admits that she has had a few stressors in the past week & became emotional when speaking about it. Pt advised that pain could be anxiety related however the safest thing to do is to follow the recommendation of the triage RN. Pt reluctantly agrees & states she will go once she drops her son off at his dorm.

## 2021-07-12 NOTE — ED Provider Notes (Signed)
Emergency Medicine Provider Triage Evaluation Note  BAYLOR TEEGARDEN , a 55 y.o. female  was evaluated in triage.  Pt complains of cp.  Patient had chest pain last night and again today.  Both episodes lasted for short period time before resolving.  She continues to have some mild chest tightness, which has been present for several weeks.  Review of Systems  Positive: Cp, chest tightness Negative: fever  Physical Exam  Temp 97.9 F (36.6 C) (Oral)   LMP 12/31/2017  Gen:   Awake, no distress   Resp:  Normal effort  MSK:   Moves extremities without difficulty  Other:  rrr  Medical Decision Making  Medically screening exam initiated at 7:33 PM.  Appropriate orders placed.  Levon Hedger was informed that the remainder of the evaluation will be completed by another provider, this initial triage assessment does not replace that evaluation, and the importance of remaining in the ED until their evaluation is complete.  Cp w/u    Franchot Heidelberg, PA-C 07/12/21 1934    Jeanell Sparrow, DO 07/12/21 1953

## 2021-07-12 NOTE — ED Triage Notes (Signed)
Pt states that she had L sided CP that woke her up last night, CP happened again today, some nausea

## 2021-07-13 ENCOUNTER — Emergency Department (HOSPITAL_COMMUNITY): Payer: BC Managed Care – PPO

## 2021-07-13 LAB — D-DIMER, QUANTITATIVE: D-Dimer, Quant: 0.28 ug/mL-FEU (ref 0.00–0.50)

## 2021-07-13 MED ORDER — NAPROXEN 500 MG PO TABS: 500.0000 mg | ORAL_TABLET | Freq: Two times a day (BID) | ORAL | 0 refills | Status: DC | PRN

## 2021-07-13 MED ORDER — NAPROXEN 375 MG PO TABS: 375.0000 mg | ORAL_TABLET | Freq: Two times a day (BID) | ORAL | 0 refills | Status: DC | PRN

## 2021-07-13 MED ORDER — METHOCARBAMOL 500 MG PO TABS: 500.0000 mg | ORAL_TABLET | Freq: Three times a day (TID) | ORAL | 0 refills | Status: DC | PRN

## 2021-07-13 NOTE — Discharge Instructions (Addendum)
You were seen in the emergency department today for chest pain. Your work-up in the emergency department has been overall reassuring. Your labs have been fairly normal and or similar to previous blood work you have had done. Your EKG and the enzyme we use to check your heart did not show an acute heart attack at this time. Your chest x-ray was normal.   Please try the following medicines to help with pain:  - Naproxen is a nonsteroidal anti-inflammatory medication that will help with pain and swelling. Be sure to take this medication as prescribed with food, 1 pill every 12 hours,  It should be taken with food, as it can cause stomach upset, and more seriously, stomach bleeding. Do not take other nonsteroidal anti-inflammatory medications with this such as Advil, Motrin, Aleve, Mobic, Goodie Powder, or Motrin.    - Robaxin is the muscle relaxer I have prescribed, this is meant to help with muscle tightness. Be aware that this medication may make you drowsy therefore the first time you take this it should be at a time you are in an environment where you can rest. Do not drive or operate heavy machinery when taking this medication. Do not drink alcohol or take other sedating medications with this medicine such as narcotics or benzodiazepines.   You make take Tylenol per over the counter dosing with these medications.   We have prescribed you new medication(s) today. Discuss the medications prescribed today with your pharmacist as they can have adverse effects and interactions with your other medicines including over the counter and prescribed medications. Seek medical evaluation if you start to experience new or abnormal symptoms after taking one of these medicines, seek care immediately if you start to experience difficulty breathing, feeling of your throat closing, facial swelling, or rash as these could be indications of a more serious allergic reaction  We would like you to follow up closely with your  primary care provider and/or the cardiologist provided in your discharge instructions within 1-3 days. Return to the ER immediately should you experience any new or worsening symptoms including but not limited to return of pain, worsened pain, vomiting, shortness of breath, dizziness, lightheadedness, passing out, or any other concerns that you may have.   Your blood pressure was noted to be elevated, please have this rechecked at your follow-up appointment.

## 2021-07-13 NOTE — ED Provider Notes (Signed)
[facility] EMERGENCY DEPARTMENT Provider Note   CSN: [id number] Arrival date & time: 07/12/21  1724     History Chief Complaint  Patient presents with   Chest Pain    DINARA LUPU is a 55 y.o. female with a hx of fibroids and asthma who presents to the ED with complaints of chest pain. Patient reports she has had 2 brief episodes of sharp chest discomfort, one last evening that woke her from sleep and one this AM while sitting petting her cat. This AM the pain took her breath away temporarily. No other significant associated sxs. She has otherwise had persistent mild chest heaviness. States she has had a lot of increased stress w/ deaths in the family over the past few weeks. She has had a cough for about 6 weeks as well, sometimes productive. Has had negative covid 19 tests. Family hx of CAD, not at an early age though. Denies unilateral leg pain/swelling, hemoptysis, recent surgery/trauma, recent long travel, personal hx of cancer, or hx of DVT/PE. She does use estrogen patches.    HPI     Past Medical History:  Diagnosis Date   Allergy    Asthma    Cardiac arrhythmia due to congenital heart disease    Chronic back pain    Uterine fibroid     Patient Active Problem List   Diagnosis Date Noted   Colon cancer screening 10/19/2020   Family history of malignant neoplasm of gastrointestinal tract 10/19/2020   Acute upper respiratory infection 03/04/2018   ETD (Eustachian tube dysfunction), bilateral 03/04/2018   Biceps tendinitis of left shoulder 07/22/2016   Bursitis of left shoulder 07/01/2016   Morton's neuroma of second interspace of left foot 07/01/2016   Performance anxiety 04/23/2014   Asthma 03/26/2011   Allergic rhinitis 03/26/2011   Allergic conjunctivitis 03/26/2011   Anxiety 03/26/2011    Past Surgical History:  Procedure Laterality Date   GANGLION CYST EXCISION  1980   HYSTEROSCOPY  1999   uterine fibroids     OB History     Gravida   0   Para  0   Term  0   Preterm  0   AB  0   Living  0      SAB  0   IAB  0   Ectopic  0   Multiple  0   Live Births  0           Family History  Problem Relation Age of Onset   Arthritis Other    Cancer Other        colon   Heart disease Other    Mental illness Other    Cancer Paternal Grandfather        colon   Atrial fibrillation Mother     Social History   Tobacco Use   Smoking status: Never   Smokeless tobacco: Never  Vaping Use   Vaping Use: Never used  Substance Use Topics   Alcohol use: Yes    Alcohol/week: 2.0 standard drinks    Types: 2 Glasses of wine per week    Comment: 2 glasses of wine a night    Drug use: No    Home Medications Prior to Admission medications   Medication Sig Start Date End Date Taking? Authorizing Provider  albuterol (PROAIR HFA) 108 (90 Base) MCG/ACT inhaler Inhale 1 puff into the lungs every 6 (six) hours as needed for wheezing or shortness of breath. 05/31/21  Yes  Hawks, Christy A, FNP  citalopram (CELEXA) 20 MG tablet Take 1 tablet (20 mg total) by mouth daily. Patient taking differently: Take 20 mg by mouth at bedtime. 10/19/20  Yes Burchette, Alinda Sierras, MD  estradiol (VIVELLE-DOT) 0.075 MG/24HR Place 1 patch onto the skin 2 (two) times a week. Patient taking differently: Place 1 patch onto the skin 2 (two) times a week. Changes patch on tuesdays and fridays 03/28/21  Yes Princess Bruins, MD  loratadine (CLARITIN) 10 MG tablet Take 10 mg by mouth at bedtime.   Yes [provider]  montelukast (SINGULAIR) 10 MG tablet Take 1 tablet (10 mg total) by mouth at bedtime. 10/19/20  Yes Burchette, Alinda Sierras, MD  naproxen sodium (ALEVE) 220 MG tablet Take 220 mg by mouth daily as needed (pain/headache).   Yes [provider]  progesterone (PROMETRIUM) 100 MG capsule Take 1 capsule (100 mg total) by mouth at bedtime. 03/28/21  Yes Princess Bruins, MD  predniSONE (STERAPRED UNI-PAK 21 TAB) 10 MG (21) TBPK  tablet Use as directed Patient not taking: Reported on 07/13/2021 05/31/21   Evelina Dun A, FNP    Allergies    Pollen extract, Dust mite extract, Latex, and Tree extract  Review of Systems   Review of Systems  Constitutional:  Negative for chills and fever.  Respiratory:  Positive for cough and shortness of breath.   Cardiovascular:  Positive for chest pain. Negative for leg swelling.  Gastrointestinal:  Negative for abdominal pain and vomiting.  Neurological:  Negative for dizziness and syncope.  Psychiatric/Behavioral:  The patient is nervous/anxious.   All other systems reviewed and are negative.  Physical Exam Updated Vital Signs BP (!) 144/73   Pulse 62   Temp 97.7 F (36.5 C) (Oral)   Resp 16   LMP 12/31/2017   SpO2 100%   Physical Exam Vitals and nursing note reviewed.  Constitutional:      General: She is not in acute distress.    Appearance: She is well-developed. She is not toxic-appearing.  HENT:     Head: Normocephalic and atraumatic.  Eyes:     General:        Right eye: No discharge.        Left eye: No discharge.     Conjunctiva/sclera: Conjunctivae normal.  Cardiovascular:     Rate and Rhythm: Normal rate and regular rhythm.     Pulses:          Radial pulses are 2+ on the right side and 2+ on the left side.       Posterior tibial pulses are 2+ on the right side and 2+ on the left side.  Pulmonary:     Effort: Pulmonary effort is normal. No respiratory distress.     Breath sounds: Normal breath sounds. No wheezing, rhonchi or rales.  Chest:     Chest wall: Tenderness (mild anterior chest wall) present.  Abdominal:     General: There is no distension.     Palpations: Abdomen is soft.     Tenderness: There is no abdominal tenderness. There is no guarding or rebound.     Comments: Question small abdominal wall defect at the umbilicus. No overlying skin changes or findings of incarceration.   Musculoskeletal:     Cervical back: Neck supple.      Right lower leg: No tenderness. No edema.     Left lower leg: No tenderness. No edema.  Skin:    General: Skin is warm and dry.  Findings: No rash.  Neurological:     Mental Status: She is alert.     Comments: Clear speech.   Psychiatric:        Behavior: Behavior normal.    ED Results / Procedures / Treatments   Labs (all labs ordered are listed, but only abnormal results are displayed) Labs Reviewed  COMPREHENSIVE METABOLIC PANEL - Abnormal; Notable for the following components:      Result Value   Total Protein 6.4 (*)    All other components within normal limits  CBC WITH DIFFERENTIAL/PLATELET  D-DIMER, QUANTITATIVE  TROPONIN I (HIGH SENSITIVITY)  TROPONIN I (HIGH SENSITIVITY)    EKG None  Radiology DG Chest 2 View  Result Date: 07/13/2021 CLINICAL DATA:  Left sided chest pain for 2 days EXAM: CHEST - 2 VIEW COMPARISON:  06/18/2021 FINDINGS: The heart size and mediastinal contours are within normal limits. Both lungs are clear. The visualized skeletal structures are unremarkable. IMPRESSION: No active cardiopulmonary disease. Electronically Signed   By: Inez Catalina M.D.   On: 07/13/2021 03:48    Procedures Procedures   Medications Ordered in ED Medications - No data to display  ED Course  I have reviewed the triage vital signs and the nursing notes.  Pertinent labs & imaging results that were available during my care of the patient were reviewed by me and considered in my medical decision making (see chart for details).    MDM Rules/Calculators/A&P                           Patient presents to the emergency department with chest pain. Patient nontoxic appearing, in no apparent distress, vitals with elevated blood pressure, low suspicion for hypertensive emergency.  Mild anterior chest wall tenderness to palpation on exam.   DDX: ACS, pulmonary embolism, dissection, pneumothorax, pneumonia, arrhythmia, severe anemia, MSK, GERD, anxiety, abdominal process,  pleurisy.   Additional history obtained:  Additional history obtained from chart review & nursing note review.   EKG: No STEMI  Lab Tests:  I reviewed and interpreted labs, which included:  CBC: Unremarkable.  CMP: NO significant abnormalities.  Troponin: WNL, flat D-dimer: WNL  Imaging Studies ordered:  I ordered imaging studies which included CXR, I independently reviewed, formal radiology impression shows: No active cardiopulmonary disease.  ED Course:   Heart Pathway Score low risk- EKG without obvious acute ischemia, delta troponin negative, doubt ACS. Patient is low risk wells, ddimer WNL, doubt pulmonary embolism. Pain is not a tearing sensation, symmetric pulses, no widening of mediastinum on CXR, doubt dissection.  Chest x-ray and labs overall reassuring.  Will trial NSAID and muscle relaxer for possible chest wall/pleurisy pain with her prior cough, stress may be also contributing.. Patient has appeared hemodynamically stable throughout ER visit and appears safe for discharge with close PCP/cardiology follow up. I discussed results, treatment plan, need for  follow-up, and return precautions with the patient. Provided opportunity for questions, patient confirmed understanding and is in agreement with plan.    Portions of this note were generated with [employer]. Dictation errors may occur despite best attempts at proofreading.   Final Clinical Impression(s) / ED Diagnoses Final diagnoses:  Chest pain, unspecified type    Rx / DC Orders ED Discharge Orders          Ordered    naproxen (NAPROSYN) 500 MG tablet  2 times daily PRN,   Status:  Discontinued  07/13/21 0500    naproxen (NAPROSYN) 375 MG tablet  2 times daily PRN        07/13/21 0500    methocarbamol (ROBAXIN) 500 MG tablet  Every 8 hours PRN        07/13/21 0503             Amaryllis Dyke, PA-C 07/13/21 0503    Quintella Reichert, MD 07/13/21 [phone]

## 2021-07-15 ENCOUNTER — Telehealth: Payer: Self-pay | Admitting: *Deleted

## 2021-07-15 NOTE — Telephone Encounter (Signed)
Pharmacy called regarding two Rx sent in for naproxen with different doses.  RNCM reviewed chart to find that naproxen 500 mg tablet was discontinued; relayed information to pharmacist.

## 2021-09-05 ENCOUNTER — Encounter: Payer: Self-pay | Admitting: Family Medicine

## 2021-10-16 ENCOUNTER — Other Ambulatory Visit: Payer: Self-pay | Admitting: Family Medicine

## 2022-02-14 ENCOUNTER — Encounter: Payer: Self-pay | Admitting: Family Medicine

## 2022-02-14 ENCOUNTER — Ambulatory Visit (INDEPENDENT_AMBULATORY_CARE_PROVIDER_SITE_OTHER): Payer: BC Managed Care – PPO | Admitting: Family Medicine

## 2022-02-14 VITALS — BP 108/64 | HR 65 | Temp 97.9°F | Ht 61.03 in | Wt 146.8 lb

## 2022-02-14 DIAGNOSIS — S20469A Insect bite (nonvenomous) of unspecified back wall of thorax, initial encounter: Secondary | ICD-10-CM | POA: Diagnosis not present

## 2022-02-14 DIAGNOSIS — W57XXXA Bitten or stung by nonvenomous insect and other nonvenomous arthropods, initial encounter: Secondary | ICD-10-CM | POA: Diagnosis not present

## 2022-02-14 DIAGNOSIS — S60861A Insect bite (nonvenomous) of right wrist, initial encounter: Secondary | ICD-10-CM | POA: Diagnosis not present

## 2022-02-14 NOTE — Progress Notes (Signed)
Established Patient Office Visit  Subjective   Patient ID: Shelly Combs, female    DOB: 06/21/1966  Age: 56 y.o. MRN: [id number]  Chief Complaint  Patient presents with   Insect Bite    Patient complains of tick bite, x2 weeks     HPI    Landen is seen following recent tick bites.  She had 1 tick on her right waist and a couple on her back.  These were removed on Saturday the 27th.  She and her family been camping.  These were very small ticks- possibly deer ticks- but she is not sure.  She denies any headache, fever, increased arthralgias.  No other generalized rash.  She had some mild itching and redness at the site of the bites.  She does have longstanding history of recurrent depression.  Is currently on Celexa 20 mg daily.  We had written previously for Wellbutrin.  She does have some low energy and low motivation at times.  We had discussed possible tapering down on her Celexa dose and initiating Wellbutrin.  Past Medical History:  Diagnosis Date   Allergy    Asthma    Cardiac arrhythmia due to congenital heart disease    Chronic back pain    Uterine fibroid    Past Surgical History:  Procedure Laterality Date   GANGLION CYST EXCISION  1980   HYSTEROSCOPY  1999   uterine fibroids    reports that she has never smoked. She has never used smokeless tobacco. She reports current alcohol use of about 2.0 standard drinks of alcohol per week. She reports that she does not use drugs. family history includes Arthritis in an other family member; Atrial fibrillation in her mother; Cancer in her paternal grandfather and another family member; Heart disease in an other family member; Mental illness in an other family member. Allergies  Allergen Reactions   Pollen Extract Cough, Itching and Shortness Of Breath   Dust Mite Extract Itching   Latex     Other reaction(s): itching   Tree Extract Itching    Review of Systems  Constitutional:  Negative for chills and fever.   Musculoskeletal:  Negative for joint pain.  Neurological:  Negative for headaches.      Objective:     BP 108/64 (BP Location: Left Arm, Patient Position: Sitting, Cuff Size: Normal)   Pulse 65   Temp 97.9 F (36.6 C) (Oral)   Ht 5' 1.03" (1.55 m)   Wt 146 lb 12.8 oz (66.6 kg)   LMP 12/31/2017   SpO2 99%   BMI 27.71 kg/m    Physical Exam Vitals reviewed.  Cardiovascular:     Rate and Rhythm: Normal rate and regular rhythm.  Pulmonary:     Effort: Pulmonary effort is normal.     Breath sounds: Normal breath sounds.  Skin:    Comments: She has a couple small punctate areas including right lower waist and mid to upper back region at sites of tick bites.  No visible retained tick parts.  No warmth.  Nontender.  Neurological:     Mental Status: She is alert.      No results found for any visits on 02/14/22.    The 10-year ASCVD risk score (Arnett DK, et al., 2019) is: 1.6%    Assessment & Plan:   Multiple recent tick bites as above with local allergic reaction.  We reviewed signs and symptoms of tick related illness such as Lyme disease and [place] spotted fever.  Handout given.  Follow-up promptly for any persistent new headache, fever, arthralgia, or any other concern such as new rash  No follow-ups on file.    Carolann Littler, MD

## 2022-04-01 ENCOUNTER — Encounter: Payer: Self-pay | Admitting: Obstetrics & Gynecology

## 2022-04-01 ENCOUNTER — Other Ambulatory Visit (HOSPITAL_COMMUNITY)
Admission: RE | Admit: 2022-04-01 | Discharge: 2022-04-01 | Disposition: A | Discharge status: Home / Self Care | Payer: BC Managed Care – PPO | Source: Ambulatory Visit | Attending: Obstetrics & Gynecology | Admitting: Obstetrics & Gynecology

## 2022-04-01 ENCOUNTER — Ambulatory Visit (INDEPENDENT_AMBULATORY_CARE_PROVIDER_SITE_OTHER): Payer: BC Managed Care – PPO | Admitting: Obstetrics & Gynecology

## 2022-04-01 VITALS — BP 106/68 | HR 68 | Ht 61.25 in | Wt 148.0 lb

## 2022-04-01 DIAGNOSIS — Z79890 Hormone replacement therapy (postmenopausal): Secondary | ICD-10-CM

## 2022-04-01 DIAGNOSIS — Z01419 Encounter for gynecological examination (general) (routine) without abnormal findings: Secondary | ICD-10-CM | POA: Diagnosis present

## 2022-04-01 MED ORDER — ESTRADIOL 0.075 MG/24HR TD PTTW: 1.0000 | MEDICATED_PATCH | TRANSDERMAL | 4 refills | Status: DC

## 2022-04-01 MED ORDER — PROGESTERONE MICRONIZED 100 MG PO CAPS: 100.0000 mg | ORAL_CAPSULE | Freq: Every day | ORAL | 4 refills | Status: DC

## 2022-04-01 NOTE — Progress Notes (Signed)
RANELLE AUKER 06/05/1966 [id number]   History:    56 y.o. G0 Married.  Adopted son is 23 yo.  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   RP:  Established patient presenting for annual gyn exam   HPI: Postmenopausal on HRT with Estradiol patch 0.05 twice a week and Prometrium 100 mg HS.  No PMB.  Hot flushes and night sweats well controled.  No major depression Sxs on Celexa.  No pelvic pain.  No pain with IC.  Pap 01/2019 Neg.  No h/o abnormal Pap.  Pap reflex today. Urine/BMs normal.  Breasts normal.  Mammo Neg 06/2021.  BMI 27.74.  Regular fitness activities.  Health Labs with [facility] 6 yrs ago.      Past medical history,surgical history, family history and social history were all reviewed and documented in the EPIC chart.  Gynecologic History Patient's last menstrual period was 12/31/2017.  Obstetric History OB History  Gravida Para Term Preterm AB Living  0 0 0 0 0 0  SAB IAB Ectopic Multiple Live Births  0 0 0 0 0     ROS: A ROS was performed and pertinent positives and negatives are included in the history.  GENERAL: No fevers or chills. HEENT: No change in vision, no earache, sore throat or sinus congestion. NECK: No pain or stiffness. CARDIOVASCULAR: No chest pain or pressure. No palpitations. PULMONARY: No shortness of breath, cough or wheeze. GASTROINTESTINAL: No abdominal pain, nausea, vomiting or diarrhea, melena or bright red blood per rectum. GENITOURINARY: No urinary frequency, urgency, hesitancy or dysuria. MUSCULOSKELETAL: No joint or muscle pain, no back pain, no recent trauma. DERMATOLOGIC: No rash, no itching, no lesions. ENDOCRINE: No polyuria, polydipsia, no heat or cold intolerance. No recent change in weight. HEMATOLOGICAL: No anemia or easy bruising or bleeding. NEUROLOGIC: No headache, seizures, numbness, tingling or weakness. PSYCHIATRIC: No depression, no loss of interest in normal activity or change in sleep pattern.     Exam:   BP 106/68    Pulse 68   Ht 5' 1.25" (1.556 m)   Wt 148 lb (67.1 kg)   LMP 12/31/2017   SpO2 99%   BMI 27.74 kg/m   Body mass index is 27.74 kg/m.  General appearance : Well developed well nourished female. No acute distress HEENT: Eyes: no retinal hemorrhage or exudates,  Neck supple, trachea midline, no carotid bruits, no thyroidmegaly Lungs: Clear to auscultation, no rhonchi or wheezes, or rib retractions  Heart: Regular rate and rhythm, no murmurs or gallops Breast:Examined in sitting and supine position were symmetrical in appearance, no palpable masses or tenderness,  no skin retraction, no nipple inversion, no nipple discharge, no skin discoloration, no axillary or supraclavicular lymphadenopathy Abdomen: no palpable masses or tenderness, no rebound or guarding Extremities: no edema or skin discoloration or tenderness  Pelvic: Vulva: Normal             Vagina: No gross lesions or discharge  Cervix: No gross lesions or discharge.  Pap reflex done.  Uterus  AV, normal size, shape and consistency, non-tender and mobile  Adnexa  Without masses or tenderness  Anus: Normal   Assessment/Plan:  56 y.o. female for annual exam   1. Encounter for routine gynecological examination with Papanicolaou smear of cervix Postmenopausal on HRT with Estradiol patch 0.05 twice a week and Prometrium 100 mg HS.  No PMB.  Hot flushes and night sweats well controled.  No major depression Sxs on Celexa.  No pelvic pain.  No pain with IC.  Pap 01/2019 Neg.  No h/o abnormal Pap.  Pap reflex today. Urine/BMs normal.  Breasts normal.  Mammo Neg 06/2021.  BMI 27.74.  Regular fitness activities.  Health Labs with [facility] 6 yrs ago.   - Cytology - PAP( [facility])  2. Postmenopausal hormone replacement therapy Postmenopausal on HRT with Estradiol patch 0.05 twice a week and Prometrium 100 mg HS.  No PMB.  Hot flushes and night sweats well controled.   Other orders - progesterone (PROMETRIUM) 100 MG capsule; Take  1 capsule (100 mg total) by mouth at bedtime. - estradiol (VIVELLE-DOT) 0.075 MG/24HR; Place 1 patch onto the skin 2 (two) times a week. Changes patch on tuesdays and fridays   Princess Bruins MD, 1:46 PM 04/01/2022

## 2022-04-07 LAB — CYTOLOGY - PAP: Diagnosis: NEGATIVE

## 2022-05-04 NOTE — Progress Notes (Unsigned)
New Patient Note  RE: Shelly Combs MRN: [id number] DOB: Apr 28, 1966 Date of Office Visit: 05/05/2022  Consult requested by: Eulas Post, MD Primary care provider: Eulas Post, MD  Chief Complaint: No chief complaint on file.  History of Present Illness: I had the pleasure of seeing Shelly Combs for initial evaluation at the [facility] on 05/04/2022. She is a 56 y.o. female, who is referred here by Eulas Post, MD for the evaluation of ***.  ***  Assessment and Plan: Shelly Combs is a 56 y.o. female with: No problem-specific Assessment & Plan notes found for this encounter.  No follow-ups on file.  No orders of the defined types were placed in this encounter.  Lab Orders  No laboratory test(s) ordered today    Other allergy screening: Asthma: {Blank single:19197::"yes","no"} Rhino conjunctivitis: {Blank single:19197::"yes","no"} Food allergy: {Blank single:19197::"yes","no"} Medication allergy: {Blank single:19197::"yes","no"} Hymenoptera allergy: {Blank single:19197::"yes","no"} Urticaria: {Blank single:19197::"yes","no"} Eczema:{Blank single:19197::"yes","no"} History of recurrent infections suggestive of immunodeficency: {Blank single:19197::"yes","no"}  Diagnostics: Spirometry:  Tracings reviewed. Her effort: {Blank single:19197::"Good reproducible efforts.","It was hard to get consistent efforts and there is a question as to whether this reflects a maximal maneuver.","Poor effort, data can not be interpreted."} FVC: ***L FEV1: ***L, ***% predicted FEV1/FVC ratio: ***% Interpretation: {Blank single:19197::"Spirometry consistent with mild obstructive disease","Spirometry consistent with moderate obstructive disease","Spirometry consistent with severe obstructive disease","Spirometry consistent with possible restrictive disease","Spirometry consistent with mixed obstructive and restrictive disease","Spirometry uninterpretable due to  technique","Spirometry consistent with normal pattern","No overt abnormalities noted given today's efforts"}.  Please see scanned spirometry results for details.  Skin Testing: {Blank single:19197::"Select foods","Environmental allergy panel","Environmental allergy panel and select foods","Food allergy panel","None","Deferred due to recent antihistamines use"}. *** Results discussed with patient/family.   Past Medical History: Patient Active Problem List   Diagnosis Date Noted  . Colon cancer screening 10/19/2020  . Family history of malignant neoplasm of gastrointestinal tract 10/19/2020  . Acute upper respiratory infection 03/04/2018  . ETD (Eustachian tube dysfunction), bilateral 03/04/2018  . Biceps tendinitis of left shoulder 07/22/2016  . Bursitis of left shoulder 07/01/2016  . Morton's neuroma of second interspace of left foot 07/01/2016  . Performance anxiety 04/23/2014  . Asthma 03/26/2011  . Allergic rhinitis 03/26/2011  . Allergic conjunctivitis 03/26/2011  . Anxiety 03/26/2011   Past Medical History:  Diagnosis Date  . Allergy   . Asthma   . Cardiac arrhythmia due to congenital heart disease   . Chronic back pain   . Uterine fibroid    Past Surgical History: Past Surgical History:  Procedure Laterality Date  . GANGLION CYST EXCISION  1980  . HYSTEROSCOPY  1999   uterine fibroids   Medication List:  Current Outpatient Medications  Medication Sig Dispense Refill  . albuterol (PROAIR HFA) 108 (90 Base) MCG/ACT inhaler Inhale 1 puff into the lungs every 6 (six) hours as needed for wheezing or shortness of breath. 17 g 1  . citalopram (CELEXA) 20 MG tablet TAKE 1 TABLET(20 MG) BY MOUTH DAILY 90 tablet 3  . estradiol (VIVELLE-DOT) 0.075 MG/24HR Place 1 patch onto the skin 2 (two) times a week. Changes patch on tuesdays and fridays 24 patch 4  . loratadine (CLARITIN) 10 MG tablet Take 10 mg by mouth at bedtime.    . methocarbamol (ROBAXIN) 500 MG tablet Take 1  tablet (500 mg total) by mouth every 8 (eight) hours as needed for muscle spasms. 15 tablet 0  . montelukast (SINGULAIR) 10 MG tablet TAKE 1 TABLET(10  MG) BY MOUTH AT BEDTIME 90 tablet 3  . naproxen sodium (ALEVE) 220 MG tablet Take 220 mg by mouth daily as needed (pain/headache).    . progesterone (PROMETRIUM) 100 MG capsule Take 1 capsule (100 mg total) by mouth at bedtime. 90 capsule 4   No current facility-administered medications for this visit.   Allergies: Allergies  Allergen Reactions  . Pollen Extract Cough, Itching and Shortness Of Breath  . Dust Mite Extract Itching  . Latex     Other reaction(s): itching  . Tree Extract Itching   Social History: Social History   Socioeconomic History  . Marital status: Married    Spouse name: Not on file  . Number of children: Not on file  . Years of education: Not on file  . Highest education level: Not on file  Occupational History  . Not on file  Tobacco Use  . Smoking status: Never  . Smokeless tobacco: Never  Vaping Use  . Vaping Use: Never used  Substance and Sexual Activity  . Alcohol use: Yes    Alcohol/week: 2.0 standard drinks of alcohol    Types: 2 Glasses of wine per week    Comment: 2 glasses of wine a night   . Drug use: No  . Sexual activity: Yes    Partners: Male    Birth control/protection: Post-menopausal    Comment: 1st intercourse- 61, partners- more than 5  Other Topics Concern  . Not on file  Social History Narrative  . Not on file   Social Determinants of Health   Financial Resource Strain: Not on file  Food Insecurity: Not on file  Transportation Needs: Not on file  Physical Activity: Not on file  Stress: Not on file  Social Connections: Not on file   Lives in a ***. Smoking: *** Occupation: ***  Environmental HistoryFreight forwarder in the house: Estate agent in the family room: {Blank single:19197::"yes","no"} Carpet in the bedroom: {Blank  single:19197::"yes","no"} Heating: {Blank single:19197::"electric","gas","heat pump"} Cooling: {Blank single:19197::"central","window","heat pump"} Pet: {Blank single:19197::"yes ***","no"}  Family History: Family History  Problem Relation Age of Onset  . Arthritis Other   . Cancer Other        colon  . Heart disease Other   . Mental illness Other   . Cancer Paternal Grandfather        colon  . Atrial fibrillation Mother    Problem                               Relation Asthma                                   *** Eczema                                *** Food allergy                          *** Allergic rhino conjunctivitis     ***  Review of Systems  Constitutional:  Negative for appetite change, chills, fever and unexpected weight change.  HENT:  Negative for congestion and rhinorrhea.   Eyes:  Negative for itching.  Respiratory:  Negative for cough, chest tightness, shortness of breath and wheezing.   Cardiovascular:  Negative for chest  pain.  Gastrointestinal:  Negative for abdominal pain.  Genitourinary:  Negative for difficulty urinating.  Skin:  Negative for rash.  Neurological:  Negative for headaches.   Objective: LMP 12/31/2017  There is no height or weight on file to calculate BMI. Physical Exam Vitals and nursing note reviewed.  Constitutional:      Appearance: Normal appearance. She is well-developed.  HENT:     Head: Normocephalic and atraumatic.     Right Ear: Tympanic membrane and external ear normal.     Left Ear: Tympanic membrane and external ear normal.     Nose: Nose normal.     Mouth/Throat:     Mouth: Mucous membranes are moist.     Pharynx: Oropharynx is clear.  Eyes:     Conjunctiva/sclera: Conjunctivae normal.  Cardiovascular:     Rate and Rhythm: Normal rate and regular rhythm.     Heart sounds: Normal heart sounds. No murmur heard.    No friction rub. No gallop.  Pulmonary:     Effort: Pulmonary effort is normal.     Breath  sounds: Normal breath sounds. No wheezing, rhonchi or rales.  Musculoskeletal:     Cervical back: Neck supple.  Skin:    General: Skin is warm.     Findings: No rash.  Neurological:     Mental Status: She is alert and oriented to person, place, and time.  Psychiatric:        Behavior: Behavior normal.  The plan was reviewed with the patient/family, and all questions/concerned were addressed.  It was my pleasure to see Shelly Combs today and participate in her care. Please feel free to contact me with any questions or concerns.  Sincerely,  Rexene Alberts, DO Allergy & Immunology  [facility] office: [place] office: [phone]

## 2022-05-05 ENCOUNTER — Encounter: Payer: Self-pay | Admitting: Allergy

## 2022-05-05 ENCOUNTER — Ambulatory Visit: Payer: BC Managed Care – PPO | Admitting: Allergy

## 2022-05-05 VITALS — BP 122/72 | HR 80 | Temp 98.2°F | Resp 12 | Ht 61.81 in | Wt 149.8 lb

## 2022-05-05 DIAGNOSIS — J3089 Other allergic rhinitis: Secondary | ICD-10-CM

## 2022-05-05 DIAGNOSIS — T781XXA Other adverse food reactions, not elsewhere classified, initial encounter: Secondary | ICD-10-CM | POA: Diagnosis not present

## 2022-05-05 DIAGNOSIS — T781XXD Other adverse food reactions, not elsewhere classified, subsequent encounter: Secondary | ICD-10-CM

## 2022-05-05 DIAGNOSIS — J4540 Moderate persistent asthma, uncomplicated: Secondary | ICD-10-CM

## 2022-05-05 MED ORDER — FLUTICASONE FUROATE-VILANTEROL 100-25 MCG/ACT IN AEPB: 1.0000 | INHALATION_SPRAY | Freq: Every day | RESPIRATORY_TRACT | 3 refills | Status: DC

## 2022-05-05 NOTE — Assessment & Plan Note (Signed)
Noted perioral pruritus with soy and fresh apples. Chobani yogurt and leftover fish caused dizziness in the past. Noted increased allergic symptoms after red meat consumption. Alpha gal in the past was negative.  Today's skin testing showed: Negative to soy and apple.  . Continue to avoid foods that are bothersome - soy, fresh apples, Chobani yogurt, old fish. . 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 o This is caused by cross reactivity of pollen with fresh fruits and vegetables, and nuts. Symptoms are usually localized in the form of itching and burning in mouth and throat. Very rarely it can progress to more severe symptoms. Eating foods in cooked or processed forms usually minimizes symptoms. I recommended avoidance of eating the problem foods, especially during the peak season(s). Sometimes, OFAS can induce severe throat swelling or even a systemic reaction; with such instance, I advised them to report to a local ER. A list of common pollens and food cross-reactivities was provided to the patient.  . Avoid red meat for now. o Will recheck alpha-gal bloodwork.

## 2022-05-05 NOTE — Patient Instructions (Addendum)
Today's skin testing showed: Positive to dust mites.  Negative to soy and apple.   Results given.  Environmental allergies Start environmental control measures as below. Use over the counter antihistamines such as Zyrtec (cetirizine), Claritin (loratadine), Allegra (fexofenadine), or Xyzal (levocetirizine) daily as needed. May take twice a day during allergy flares. May switch antihistamines every few months. Continue Singulair (montelukast) '10mg'$  daily at night. May take Flonase Sensamist nasal spray 1 spray per nostril twice a day as needed for nasal congestion.  Nasal saline spray (i.e., Simply Saline) or nasal saline lavage (i.e., NeilMed) is recommended as needed and prior to medicated nasal sprays. Consider allergy injections for long term control if above medications do not help the symptoms - handout given.   Breathing Daily controller medication(s): start Breo 157mg 1 puff once a day and rinse mouth after each use. Demonstrated proper use.  May use albuterol rescue inhaler 2 puffs every 4 to 6 hours as needed for shortness of breath, chest tightness, coughing, and wheezing. May use albuterol rescue inhaler 2 puffs 5 to 15 minutes prior to strenuous physical activities. Monitor frequency of use.  Asthma control goals:  Full participation in all desired activities (may need albuterol before activity) Albuterol use two times or less a week on average (not counting use with activity) Cough interfering with sleep two times or less a month Oral steroids no more than once a year No hospitalizations   Food Continue to avoid foods that are bothersome - soy, fresh apples, chobani yogurt, old fish.  Discussed that her food triggered oral and throat symptoms are likely caused by oral food allergy syndrome (OFAS). This is caused by cross reactivity of pollen with fresh fruits and vegetables, and nuts. Symptoms are usually localized in the form of itching and burning in mouth and throat. Very  rarely it can progress to more severe symptoms. Eating foods in cooked or processed forms usually minimizes symptoms. I recommended avoidance of eating the problem foods, especially during the peak season(s). Sometimes, OFAS can induce severe throat swelling or even a systemic reaction; with such instance, I advised them to report to a local ER. A list of common pollens and food cross-reactivities was provided to the patient.   Avoid red meat for now. Will recheck alpha-gal bloodwork.   Itchy skin See below for proper skin care.  Follow up in 2 months or sooner if needed.  [place]. [place] [address] [address](white building). [place]: [phone](same phone number).   Control of House Dust Mite Allergen Dust mite allergens are a common trigger of allergy and asthma symptoms. While they can be found throughout the house, these microscopic creatures thrive in warm, humid environments such as bedding, upholstered furniture and carpeting. Because so much time is spent in the bedroom, it is essential to reduce mite levels there.  Encase pillows, mattresses, and box springs in special allergen-proof fabric covers or airtight, zippered plastic covers.  Bedding should be washed weekly in hot water (130 F) and dried in a hot dryer. Allergen-proof covers are available for comforters and pillows that can't be regularly washed.  Wash the allergy-proof covers every few months. Minimize clutter in the bedroom. Keep pets out of the bedroom.  Keep humidity less than 50% by using a dehumidifier or air conditioning. You can buy a humidity measuring device called a hygrometer to monitor this.  If possible, replace carpets with  hardwood, linoleum, or washable area rugs. If that's not possible, vacuum frequently with a vacuum that has a HEPA filter. Remove all upholstered furniture and non-washable window drapes from the  bedroom. Remove all non-washable stuffed toys from the bedroom.  Wash stuffed toys weekly.

## 2022-05-05 NOTE — Assessment & Plan Note (Signed)
Perennial rhinoconjunctivitis symptoms for 30+ years with worsening in the spring.  Tried Singulair, Flonase, Claritin with some benefit.  Skin testing over 30 years ago showed multiple positives per patient report.  No prior AIT.  Today's skin testing showed: Positive to dust mites.   Start environmental control measures as below.  Use over the counter antihistamines such as Zyrtec (cetirizine), Claritin (loratadine), Allegra (fexofenadine), or Xyzal (levocetirizine) daily as needed. May take twice a day during allergy flares. May switch antihistamines every few months.  Continue Singulair (montelukast) '10mg'$  daily at night.  May take Flonase Sensamist nasal spray 1 spray per nostril twice a day as needed for nasal congestion.   Nasal saline spray (i.e., Simply Saline) or nasal saline lavage (i.e., NeilMed) is recommended as needed and prior to medicated nasal sprays.  Consider allergy injections for long term control if above medications do not help the symptoms - handout given.

## 2022-05-05 NOTE — Assessment & Plan Note (Signed)
Daily symptoms with chest tightness, wheezing, coughing.  Currently on Singulair daily.  Used to be on Qvar in the past but still had symptoms.  Had COVID-19 twice.  Today's spirometry showed some restriction with 6% improvement FEV1 postbronchodilator treatment.  Clinically feeling improved.  Today skin testing was positive to dust mites only. . Daily controller medication(s): start Breo 126mg 1 puff once a day and rinse mouth after each use.  . May use albuterol rescue inhaler 2 puffs every 4 to 6 hours as needed for shortness of breath, chest tightness, coughing, and wheezing. May use albuterol rescue inhaler 2 puffs 5 to 15 minutes prior to strenuous physical activities. Monitor frequency of use.  . Get spirometry at next visit.

## 2022-05-07 LAB — ALPHA-GAL PANEL
Allergen Lamb IgE: <0.1 kU/L
Beef IgE: <0.1 kU/L
IgE (Immunoglobulin E), Serum: 16 IU/mL (ref 6–495)
O215-IgE Alpha-Gal: <0.1 kU/L
Pork IgE: <0.1 kU/L

## 2022-05-21 ENCOUNTER — Encounter: Payer: Self-pay | Admitting: Family Medicine

## 2022-05-21 ENCOUNTER — Ambulatory Visit: Payer: BC Managed Care – PPO | Admitting: Family Medicine

## 2022-05-21 VITALS — BP 110/70 | HR 75 | Temp 98.2°F | Ht 61.81 in | Wt 147.5 lb

## 2022-05-21 DIAGNOSIS — M542 Cervicalgia: Secondary | ICD-10-CM

## 2022-05-21 DIAGNOSIS — G4486 Cervicogenic headache: Secondary | ICD-10-CM

## 2022-05-21 MED ORDER — CYCLOBENZAPRINE HCL 5 MG PO TABS: 5.0000 mg | ORAL_TABLET | Freq: Every day | ORAL | 0 refills | Status: DC

## 2022-05-21 NOTE — Patient Instructions (Signed)
Let me know if neck not improving with conservative therapy  Use the muscle relaxer only at night- may cause some mild sedation.

## 2022-05-21 NOTE — Progress Notes (Signed)
Established Patient Office Visit  Subjective   Patient ID: Shelly Combs, female    DOB: 10-10-1965  Age: 56 y.o. MRN: [id number]  Chief Complaint  Patient presents with   Neck Pain    Patient complains of neck pain, x3 weeks, Tried Tylenol and Alleve with little relief    Headache    Patient complains of headaches, x3 weeks, Tried Tylenol and Alleve with little relief     HPI   Shelly Combs is seen with pain involving her neck region and occipital area past few weeks.  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  She had some ongoing headaches since then.  She also frequently carries fairly heavy book bag around .    Has been using some Tylenol and Aleve with very little relief.  No upper extremity radiculitis symptoms.  Denies any upper extremity weakness or numbness.  No recent head injury.  She had remote concussion about 10 years ago.  Increased stress issues with work.   Past Medical History:  Diagnosis Date   Allergy    Asthma    Cardiac arrhythmia due to congenital heart disease    Chronic back pain    Uterine fibroid    Past Surgical History:  Procedure Laterality Date   GANGLION CYST EXCISION  1980   HYSTEROSCOPY  1999   uterine fibroids    reports that she has never smoked. She has never been exposed to tobacco smoke. She has never used smokeless tobacco. She reports current alcohol use of about 2.0 standard drinks of alcohol per week. She reports that she does not use drugs. family history includes Arthritis in an other family member; Atrial fibrillation in her mother; Cancer in her paternal grandfather and another family member; Heart disease in an other family member; Mental illness in an other family member. Allergies  Allergen Reactions   Pollen Extract Cough, Itching and Shortness Of Breath   Dust Mite Extract Itching   Latex     Other  reaction(s): itching   Tree Extract Itching    Review of Systems  Constitutional:  Negative for weight loss.  Eyes:  Negative for blurred vision.  Cardiovascular:  Negative for chest pain.  Neurological:  Positive for headaches. Negative for dizziness, tingling, speech change, focal weakness, seizures and loss of consciousness.      Objective:     BP 110/70 (BP Location: Left Arm, Patient Position: Sitting, Cuff Size: Normal)   Pulse 75   Temp 98.2 F (36.8 C) (Oral)   Ht 5' 1.81" (1.57 m)   Wt 147 lb 8 oz (66.9 kg)   LMP 12/31/2017   SpO2 99%   BMI 27.14 kg/m    Physical Exam Vitals reviewed.  Constitutional:      Appearance: She is well-developed.  Eyes:     Pupils: Pupils are equal, round, and reactive to light.  Neck:     Thyroid: No thyromegaly.     Vascular: No JVD.  Cardiovascular:     Rate and Rhythm: Normal rate and regular rhythm.     Heart sounds:     No gallop.  Pulmonary:     Effort: Pulmonary effort is normal. No respiratory distress.     Breath sounds: Normal breath sounds. No wheezing or rales.  Musculoskeletal:     Cervical back: Neck supple.     Comments:  Neck reveals no spinal tenderness.  She does have some mild trapezius tenderness bilaterally.  She has good range of motion neck with neck flexion and extension as well as lateral bending to the right and left.  Neurological:     Mental Status: She is alert.     Cranial Nerves: No cranial nerve deficit.     Comments: Full strength upper extremities.      No results found for any visits on 05/21/22.    The 10-year ASCVD risk score (Arnett DK, et al., 2019) is: 1.6%    Assessment & Plan:   Patient presents with 3-week history of somewhat bilateral occipital neck pain and bilateral fairly diffuse headaches.  Suspect muscle contraction type headache.  -Recommend conservative measures with massage and topical sports creams and heat -Discussed possible trial of low-dose Flexeril 5 mg  nightly.  We did discuss potential for sedation.  Avoid daytime use. -If not improving with the above over the next week consider trial of physical therapy.  No follow-ups on file.    Carolann Littler, MD

## 2022-05-30 ENCOUNTER — Other Ambulatory Visit: Payer: Self-pay | Admitting: Family

## 2022-07-06 NOTE — Progress Notes (Unsigned)
Follow Up Note  RE: Shelly Combs MRN: [id number] DOB: May 02, 1966 Date of Office Visit: 07/07/2022  Referring provider: Eulas Post, MD Primary care provider: Eulas Post, MD  Chief Complaint: No chief complaint on file.  History of Present Illness: I had the pleasure of seeing Shelly Combs for a follow up visit at the [facility]  on 07/06/2022. She is a 56 y.o. female, who is being followed for asthma, allergic rhinoconjunctivitis, adverse food reaction. Her previous allergy office visit was on 05/05/2022 with Dr. Maudie Mercury. Today is a regular follow up visit.  Alpha-gal panel was negative. You may slowly reintroduce red meat back into your diet. If you notice symptoms then stop and let us know.  Asthma Daily symptoms with chest tightness, wheezing, coughing.  Currently on Singulair daily.  Used to be on Qvar in the past but still had symptoms.  Had COVID-19 twice. Today's spirometry showed some restriction with 6% improvement FEV1 postbronchodilator treatment.  Clinically feeling improved. Today skin testing was positive to dust mites only. Daily controller medication(s): start Breo 167mg 1 puff once a day and rinse mouth after each use.  May use albuterol rescue inhaler 2 puffs every 4 to 6 hours as needed for shortness of breath, chest tightness, coughing, and wheezing. May use albuterol rescue inhaler 2 puffs 5 to 15 minutes prior to strenuous physical activities. Monitor frequency of use.  Get spirometry at next visit.   Other allergic rhinitis Perennial rhinoconjunctivitis symptoms for 30+ years with worsening in the spring.  Tried Singulair, Flonase, Claritin with some benefit.  Skin testing over 30 years ago showed multiple positives per patient report.  No prior AIT. Today's skin testing showed: Positive to dust mites.  Start environmental control measures as below. Use over the counter antihistamines such as Zyrtec (cetirizine), Claritin  (loratadine), Allegra (fexofenadine), or Xyzal (levocetirizine) daily as needed. May take twice a day during allergy flares. May switch antihistamines every few months. Continue Singulair (montelukast) '10mg'$  daily at night. May take Flonase Sensamist nasal spray 1 spray per nostril twice a day as needed for nasal congestion.  Nasal saline spray (i.e., Simply Saline) or nasal saline lavage (i.e., NeilMed) is recommended as needed and prior to medicated nasal sprays. Consider allergy injections for long term control if above medications do not help the symptoms - handout given.    Other adverse food reactions, not elsewhere classified, subsequent encounter Noted perioral pruritus with soy and fresh apples. Chobani yogurt and leftover fish caused dizziness in the past. Noted increased allergic symptoms after red meat consumption. Alpha gal in the past was negative. Today's skin testing showed: Negative to soy and apple.  Continue to avoid foods that are bothersome - soy, fresh apples, Chobani yogurt, old fish. 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 This is caused by cross reactivity of pollen with fresh fruits and vegetables, and nuts. Symptoms are usually localized in the form of itching and burning in mouth and throat. Very rarely it can progress to more severe symptoms. Eating foods in cooked or processed forms usually minimizes symptoms. I recommended avoidance of eating the problem foods, especially during the peak season(s). Sometimes, OFAS can induce severe throat swelling or even a systemic reaction; with such instance, I advised them to report to a local ER. A list of common pollens and food cross-reactivities was provided to the patient.  Avoid red meat for  now. Will recheck alpha-gal bloodwork.     Assessment and Plan: Shelly Combs is a 56 y.o. female with: No problem-specific Assessment & Plan notes found for this  encounter.  No follow-ups on file.  No orders of the defined types were placed in this encounter.  Lab Orders  No laboratory test(s) ordered today    Diagnostics: Spirometry:  Tracings reviewed. Her effort: {Blank single:19197::"Good reproducible efforts.","It was hard to get consistent efforts and there is a question as to whether this reflects a maximal maneuver.","Poor effort, data can not be interpreted."} FVC: ***L FEV1: ***L, ***% predicted FEV1/FVC ratio: ***% Interpretation: {Blank single:19197::"Spirometry consistent with mild obstructive disease","Spirometry consistent with moderate obstructive disease","Spirometry consistent with severe obstructive disease","Spirometry consistent with possible restrictive disease","Spirometry consistent with mixed obstructive and restrictive disease","Spirometry uninterpretable due to technique","Spirometry consistent with normal pattern","No overt abnormalities noted given today's efforts"}.  Please see scanned spirometry results for details.  Skin Testing: {Blank single:19197::"Select foods","Environmental allergy panel","Environmental allergy panel and select foods","Food allergy panel","None","Deferred due to recent antihistamines use"}. *** Results discussed with patient/family.   Medication List:  Current Outpatient Medications  Medication Sig Dispense Refill  . albuterol (VENTOLIN HFA) 108 (90 Base) MCG/ACT inhaler Inhale 2 puffs into the lungs every 6 (six) hours as needed for wheezing or shortness of breath.    Marland Kitchen CALCIUM PO Take 1 tablet by mouth daily.    . citalopram (CELEXA) 20 MG tablet TAKE 1 TABLET(20 MG) BY MOUTH DAILY 90 tablet 3  . cyclobenzaprine (FLEXERIL) 5 MG tablet Take 1 tablet (5 mg total) by mouth at bedtime. 30 tablet 0  . estradiol (VIVELLE-DOT) 0.075 MG/24HR Place 1 patch onto the skin 2 (two) times a week. Changes patch on tuesdays and fridays 24 patch 4  . fluticasone (FLONASE SENSIMIST) 27.5 MCG/SPRAY nasal  spray Place 1 spray into the nose daily.    . fluticasone furoate-vilanterol (BREO ELLIPTA) 100-25 MCG/ACT AEPB Inhale 1 puff into the lungs daily. Rinse mouth after each use. 60 each 3  . Green Tea, Camellia sinensis, (GREEN TEA PO) Take by mouth daily.    Marland Kitchen loratadine (CLARITIN) 10 MG tablet Take 10 mg by mouth at bedtime.    . methocarbamol (ROBAXIN) 500 MG tablet Take 1 tablet (500 mg total) by mouth every 8 (eight) hours as needed for muscle spasms. 15 tablet 0  . montelukast (SINGULAIR) 10 MG tablet TAKE 1 TABLET(10 MG) BY MOUTH AT BEDTIME 90 tablet 3  . naproxen sodium (ALEVE) 220 MG tablet Take 220 mg by mouth daily as needed (pain/headache).    . NON FORMULARY Take by mouth daily. Lion's mane    . progesterone (PROMETRIUM) 100 MG capsule Take 1 capsule (100 mg total) by mouth at bedtime. 90 capsule 4  . VITAMIN D PO Take by mouth daily.     No current facility-administered medications for this visit.   Allergies: Allergies  Allergen Reactions  . Pollen Extract Cough, Itching and Shortness Of Breath  . Dust Mite Extract Itching  . Latex     Other reaction(s): itching  . Tree Extract Itching   I reviewed her past medical history, social history, family history, and environmental history and no significant changes have been reported from her previous visit.  Review of Systems  Constitutional:  Negative for appetite change, chills, fever and unexpected weight change.  HENT:  Positive for congestion. Negative for rhinorrhea.        Ear fullness  Eyes:  Positive for itching.  Respiratory:  Positive for cough,  chest tightness, shortness of breath and wheezing.   Cardiovascular:  Negative for chest pain.  Gastrointestinal:  Negative for abdominal pain.  Genitourinary:  Negative for difficulty urinating.  Skin:  Negative for rash.  Allergic/Immunologic: Positive for environmental allergies.  Neurological:  Negative for headaches.   Objective: LMP 12/31/2017  There is no height  or weight on file to calculate BMI. Physical Exam Vitals and nursing note reviewed.  Constitutional:      Appearance: Normal appearance. She is well-developed.  HENT:     Head: Normocephalic and atraumatic.     Right Ear: Tympanic membrane and external ear normal.     Left Ear: Tympanic membrane and external ear normal.     Nose: Congestion (on left side) present.     Mouth/Throat:     Mouth: Mucous membranes are moist.     Pharynx: Oropharynx is clear.  Eyes:     Conjunctiva/sclera: Conjunctivae normal.  Cardiovascular:     Rate and Rhythm: Normal rate and regular rhythm.     Heart sounds: Normal heart sounds. No murmur heard.    No friction rub. No gallop.  Pulmonary:     Effort: Pulmonary effort is normal.     Breath sounds: Normal breath sounds. No wheezing, rhonchi or rales.  Musculoskeletal:     Cervical back: Neck supple.  Skin:    General: Skin is warm.     Findings: No rash.  Neurological:     Mental Status: She is alert and oriented to person, place, and time.  Psychiatric:        Behavior: Behavior normal.  Previous notes and tests were reviewed. The plan was reviewed with the patient/family, and all questions/concerned were addressed.  It was my pleasure to see Shelly Combs today and participate in her care. Please feel free to contact me with any questions or concerns.  Sincerely,  Rexene Alberts, DO Allergy & Immunology  [facility] office: [place] office: [phone]

## 2022-07-07 ENCOUNTER — Ambulatory Visit: Payer: BC Managed Care – PPO | Admitting: Allergy

## 2022-07-07 ENCOUNTER — Encounter: Payer: Self-pay | Admitting: Allergy

## 2022-07-07 VITALS — BP 124/80 | HR 80 | Temp 98.1°F | Resp 18 | Ht 61.0 in | Wt 148.2 lb

## 2022-07-07 DIAGNOSIS — T781XXD Other adverse food reactions, not elsewhere classified, subsequent encounter: Secondary | ICD-10-CM | POA: Diagnosis not present

## 2022-07-07 DIAGNOSIS — J3089 Other allergic rhinitis: Secondary | ICD-10-CM | POA: Diagnosis not present

## 2022-07-07 DIAGNOSIS — J4540 Moderate persistent asthma, uncomplicated: Secondary | ICD-10-CM | POA: Diagnosis not present

## 2022-07-07 DIAGNOSIS — R21 Rash and other nonspecific skin eruption: Secondary | ICD-10-CM | POA: Diagnosis not present

## 2022-07-07 NOTE — Patient Instructions (Addendum)
Environmental allergies 2023 skin testing was positive to dust mites.  Continue environmental control measures as below. Use over the counter antihistamines such as Zyrtec (cetirizine), Claritin (loratadine), Allegra (fexofenadine), or Xyzal (levocetirizine) daily as needed. May take twice a day during allergy flares. May switch antihistamines every few months. Continue Singulair (montelukast) '10mg'$  daily at night. May take Flonase Sensamist nasal spray 1 spray per nostril 1-2 times a day as needed for nasal congestion.  Nasal saline spray (i.e., Simply Saline) or nasal saline lavage (i.e., NeilMed) is recommended as needed and prior to medicated nasal sprays.  Breathing Daily controller medication(s): continue Breo 179mg 1 puff once a day and rinse mouth after each use.  Continue Singulair (montelukast) '10mg'$  daily at night. May use albuterol rescue inhaler 2 puffs every 4 to 6 hours as needed for shortness of breath, chest tightness, coughing, and wheezing. May use albuterol rescue inhaler 2 puffs 5 to 15 minutes prior to strenuous physical activities. Monitor frequency of use.  Asthma control goals:  Full participation in all desired activities (may need albuterol before activity) Albuterol use two times or less a week on average (not counting use with activity) Cough interfering with sleep two times or less a month Oral steroids no more than once a year No hospitalizations   Food 2023 skin testing: Negative to soy and apple.  Continue to avoid foods that are bothersome - soy, fresh apples, chobani yogurt, old fish. Limit red meat.  Rash on the face Use hydrocortisone cream twice a day as needed for mild rash flares - okay to use on the face, neck, groin area. Do not use more than 1 week at a time.  Follow up in 3 months or sooner if needed.   Control of House Dust Mite Allergen Dust mite allergens are a common trigger of allergy and asthma symptoms. While they can be found  throughout the house, these microscopic creatures thrive in warm, humid environments such as bedding, upholstered furniture and carpeting. Because so much time is spent in the bedroom, it is essential to reduce mite levels there.  Encase pillows, mattresses, and box springs in special allergen-proof fabric covers or airtight, zippered plastic covers.  Bedding should be washed weekly in hot water (130 F) and dried in a hot dryer. Allergen-proof covers are available for comforters and pillows that can't be regularly washed.  Wash the allergy-proof covers every few months. Minimize clutter in the bedroom. Keep pets out of the bedroom.  Keep humidity less than 50% by using a dehumidifier or air conditioning. You can buy a humidity measuring device called a hygrometer to monitor this.  If possible, replace carpets with hardwood, linoleum, or washable area rugs. If that's not possible, vacuum frequently with a vacuum that has a HEPA filter. Remove all upholstered furniture and non-washable window drapes from the bedroom. Remove all non-washable stuffed toys from the bedroom.  Wash stuffed toys weekly.

## 2022-07-07 NOTE — Assessment & Plan Note (Addendum)
Past history - Daily symptoms with chest tightness, wheezing, coughing.  Currently on Singulair daily.  Used to be on Qvar in the past but still had symptoms.  Had COVID-19 twice. 2023 spirometry showed some restriction with 6% improvement FEV1 postbronchodilator treatment.  Clinically feeling improved. Interim history - was doing better on Breo until she had both flu and Covid-19 vaccines and made her feel extremely fatigued.  Today's spirometry was normal. . Daily controller medication(s): continue Breo 14mg 1 puff once a day and rinse mouth after each use.  . Continue Singulair (montelukast) '10mg'$  daily at night. . May use albuterol rescue inhaler 2 puffs every 4 to 6 hours as needed for shortness of breath, chest tightness, coughing, and wheezing. May use albuterol rescue inhaler 2 puffs 5 to 15 minutes prior to strenuous physical activities. Monitor frequency of use.  . Get spirometry at next visit. . Sounds like patient had a robust response to her vaccinations - recommend one vaccine at a time in the future.

## 2022-07-07 NOTE — Assessment & Plan Note (Signed)
Past history - Perennial rhinoconjunctivitis symptoms for 30+ years with worsening in the spring.  Tried Singulair, Flonase, Claritin with some benefit. No prior AIT. 2023 skin testing showed: Positive to dust mites.  Interim history - stable.   Continue environmental control measures as below.  Use over the counter antihistamines such as Zyrtec (cetirizine), Claritin (loratadine), Allegra (fexofenadine), or Xyzal (levocetirizine) daily as needed. May take twice a day during allergy flares. May switch antihistamines every few months.  Continue Singulair (montelukast) '10mg'$  daily at night.  May take Flonase Sensamist nasal spray 1 spray per nostril 1-2 times a day as needed for nasal congestion.   Nasal saline spray (i.e., Simply Saline) or nasal saline lavage (i.e., NeilMed) is recommended as needed and prior to medicated nasal sprays.

## 2022-07-07 NOTE — Assessment & Plan Note (Signed)
Rash on face - unlikely to be related to [facility] as it just appeared 2 weeks ago. . Use hydrocortisone cream twice a day as needed for mild rash flares - okay to use on the face, neck, groin area. Do not use more than 1 week at a time.

## 2022-07-07 NOTE — Assessment & Plan Note (Signed)
Past history - Noted perioral pruritus with soy and fresh apples. Chobani yogurt and leftover fish caused dizziness in the past. Noted increased allergic symptoms after red meat consumption. Alpha gal in the past was negative. 2023 skin testing showed: Negative to soy and apple.  Interim history - 2023 bloodwork negative to alpha gal. Still gets tingling in the arms after eating red meat. . Continue to avoid foods that are bothersome - soy, fresh apples, chobani yogurt, old fish. . Limit red meat.

## 2022-08-19 ENCOUNTER — Ambulatory Visit: Payer: BC Managed Care – PPO | Admitting: Family Medicine

## 2022-08-19 VITALS — BP 118/70 | HR 75 | Temp 97.6°F | Ht 61.0 in | Wt 153.3 lb

## 2022-08-19 DIAGNOSIS — J069 Acute upper respiratory infection, unspecified: Secondary | ICD-10-CM | POA: Diagnosis not present

## 2022-08-19 DIAGNOSIS — L710 Perioral dermatitis: Secondary | ICD-10-CM | POA: Diagnosis not present

## 2022-08-19 MED ORDER — DOXYCYCLINE HYCLATE 100 MG PO CAPS: 100.0000 mg | ORAL_CAPSULE | Freq: Two times a day (BID) | ORAL | 0 refills | Status: DC

## 2022-08-19 NOTE — Progress Notes (Signed)
Established Patient Office Visit  Subjective   Patient ID: Shelly Combs, female    DOB: 1965-09-23  Age: 56 y.o. MRN: [id number]  Chief Complaint  Patient presents with   Cough    Patient complains of cough, x2 weeks, Tried Albuterol, Mucinex and Ibuprofen with little relief, Productive cough with yellowish sputum    Nasal Congestion    Patient complains of nasal congestion, x2 weeks    Sore Throat   Rash    Patient complains of rash, x1 month    HPI   Shelly Combs is seen with onset about 10 days ago of cough.  Never had any fever.  She had some bilateral rib pain.  Overall some improved.  Her cough seemed to be her initial symptom and she developed some eventual nasal congestion.  COVID testing negative.  She takes Breo regularly from allergist and has used some albuterol intermittently.  No dyspnea.  No significant sore throat at this time.  Other issue is recurrent erythematous papules and a couple small pustules especially right corner of mouth.  She has seen dermatologist in the past and has been prescribed MetroGel.  She saw her allergist recently and they had suggested topical hydrocortisone cream which has not helped any.  Past Medical History:  Diagnosis Date   Allergy    Asthma    Cardiac arrhythmia due to congenital heart disease    Chronic back pain    Uterine fibroid    Past Surgical History:  Procedure Laterality Date   GANGLION CYST EXCISION  1980   HYSTEROSCOPY  1999   uterine fibroids    reports that she has never smoked. She has never been exposed to tobacco smoke. She has never used smokeless tobacco. She reports current alcohol use of about 2.0 standard drinks of alcohol per week. She reports that she does not use drugs. family history includes Arthritis in an other family member; Atrial fibrillation in her mother; Cancer in her paternal grandfather and another family member; Heart disease in an other family member; Mental illness in an other family  member. Allergies  Allergen Reactions   Pollen Extract Cough, Itching and Shortness Of Breath   Dust Mite Extract Itching   Latex     Other reaction(s): itching   Tree Extract Itching    Review of Systems  Constitutional:  Negative for chills and fever.  HENT:  Positive for congestion and sore throat.   Respiratory:  Positive for cough. Negative for hemoptysis and shortness of breath.   Skin:  Positive for rash.      Objective:     BP 118/70 (BP Location: Left Arm, Patient Position: Sitting, Cuff Size: Normal)   Pulse 75   Temp 97.6 F (36.4 C) (Oral)   Ht '5\' 1"'$  (1.549 m)   Wt 153 lb 4.8 oz (69.5 kg)   LMP 12/31/2017   SpO2 98%   BMI 28.97 kg/m    Physical Exam Vitals reviewed.  Constitutional:      General: She is not in acute distress.    Appearance: She is not ill-appearing.  HENT:     Right Ear: Tympanic membrane normal.     Left Ear: Tympanic membrane normal.     Mouth/Throat:     Mouth: Mucous membranes are dry.     Pharynx: Oropharynx is clear. No oropharyngeal exudate or posterior oropharyngeal erythema.  Cardiovascular:     Rate and Rhythm: Normal rate and regular rhythm.  Pulmonary:     Effort:  Pulmonary effort is normal.     Breath sounds: Normal breath sounds. No wheezing or rales.  Musculoskeletal:     Cervical back: Neck supple.  Lymphadenopathy:     Cervical: No cervical adenopathy.  Skin:    Findings: Rash present.     Comments: She has a few small pinpoint scattered erythematous papules and a couple small pustules right chin region  Neurological:     Mental Status: She is alert.      No results found for any visits on 08/19/22.    The 10-year ASCVD risk score (Arnett DK, et al., 2019) is: 1.8%    Assessment & Plan:   #1 cough.  Suspect acute viral bronchitis.  Nonfocal exam.  Afebrile.  Recommend supportive measures.  Continue Breo daily and albuterol as needed.  Follow-up immediately for any fever or other concerns  #2 small  papular/pustular rash right cheek area.  She does have history of known gout.  This also looks somewhat typical of perioral dermatitis.  We recommended trial of doxycycline 100 mg twice daily for 10 days and be in touch if this is not clearing  No follow-ups on file.    Carolann Littler, MD

## 2022-09-15 ENCOUNTER — Other Ambulatory Visit: Payer: Self-pay | Admitting: Obstetrics & Gynecology

## 2022-09-15 DIAGNOSIS — Z79890 Hormone replacement therapy (postmenopausal): Secondary | ICD-10-CM

## 2022-09-16 ENCOUNTER — Encounter: Payer: Self-pay | Admitting: Family Medicine

## 2022-09-16 NOTE — Telephone Encounter (Signed)
Last AEX 04/01/2022--nothing scheduled for 2024. However, recall placed. Last mammo 06/12/2021-PCP noted.  Rx request noted that pt's last refill for this medication was on 03/06/2022. Which would mean pt could have been off for a while. Will reach out to pt to inquire.

## 2022-09-17 MED ORDER — DOXYCYCLINE HYCLATE 100 MG PO CAPS: 100.0000 mg | ORAL_CAPSULE | Freq: Two times a day (BID) | ORAL | 0 refills | Status: AC

## 2022-09-18 ENCOUNTER — Other Ambulatory Visit: Payer: Self-pay | Admitting: Allergy

## 2022-10-05 NOTE — Progress Notes (Unsigned)
Follow Up Note  RE: Shelly Combs MRN: [id number] DOB: 08-09-1966 Date of Office Visit: 10/06/2022  Referring provider: Eulas Post, MD Primary care provider: Eulas Post, MD  Chief Complaint: No chief complaint on file.  History of Present Illness: I had the pleasure of seeing Shelly Combs for a follow up visit at the [facility] on 10/05/2022. She is a 57 y.o. female, who is being followed for asthma, allergic rhinitis, adverse food reaction and rash. Her previous allergy office visit was on 07/07/2022 with Dr. Maudie Mercury. Today is a regular follow up visit.  Asthma Past history - Daily symptoms with chest tightness, wheezing, coughing.  Currently on Singulair daily.  Used to be on Qvar in the past but still had symptoms.  Had COVID-19 twice. 2023 spirometry showed some restriction with 6% improvement FEV1 postbronchodilator treatment.  Clinically feeling improved. Interim history - was doing better on Breo until she had both flu and Covid-19 vaccines and made her feel extremely fatigued. Today's spirometry was normal. Daily controller medication(s): continue Breo 170mg 1 puff once a day and rinse mouth after each use.  Continue Singulair (montelukast) '10mg'$  daily at night. May use albuterol rescue inhaler 2 puffs every 4 to 6 hours as needed for shortness of breath, chest tightness, coughing, and wheezing. May use albuterol rescue inhaler 2 puffs 5 to 15 minutes prior to strenuous physical activities. Monitor frequency of use.  Get spirometry at next visit. Sounds like patient had a robust response to her vaccinations - recommend one vaccine at a time in the future.    Perennial allergic rhinitis Past history - Perennial rhinoconjunctivitis symptoms for 30+ years with worsening in the spring.  Tried Singulair, Flonase, Claritin with some benefit. No prior AIT. 2023 skin testing showed: Positive to dust mites.  Interim history - stable.  Continue environmental  control measures as below. Use over the counter antihistamines such as Zyrtec (cetirizine), Claritin (loratadine), Allegra (fexofenadine), or Xyzal (levocetirizine) daily as needed. May take twice a day during allergy flares. May switch antihistamines every few months. Continue Singulair (montelukast) '10mg'$  daily at night. May take Flonase Sensamist nasal spray 1 spray per nostril 1-2 times a day as needed for nasal congestion.  Nasal saline spray (i.e., Simply Saline) or nasal saline lavage (i.e., NeilMed) is recommended as needed and prior to medicated nasal sprays.   Other adverse food reactions, not elsewhere classified, subsequent encounter Past history - Noted perioral pruritus with soy and fresh apples. Chobani yogurt and leftover fish caused dizziness in the past. Noted increased allergic symptoms after red meat consumption. Alpha gal in the past was negative. 2023 skin testing showed: Negative to soy and apple.  Interim history - 2023 bloodwork negative to alpha gal. Still gets tingling in the arms after eating red meat. Continue to avoid foods that are bothersome - soy, fresh apples, chobani yogurt, old fish. Limit red meat.    Rash and other nonspecific skin eruption Rash on face - [facility] to be [facility] just appeared 2 weeks ago. Use hydrocortisone cream twice a day as needed for mild rash flares - okay to use on the face, neck, groin area. Do not use more than 1 week at a time.   Return in about 3 months (around 10/07/2022).  Assessment and Plan: [name] a 57y.o. female with: No problem-specific Assessment & Plan notes found for this encounter.  No follow-ups on file.  No orders of the  defined types were placed in this encounter.  Lab Orders  No laboratory test(s) ordered today    Diagnostics: Spirometry:  Tracings reviewed. Her effort: {Blank single:19197::"Good reproducible efforts.","It was hard to get consistent efforts and there is a question as to  whether this reflects a maximal maneuver.","Poor effort, data can not be interpreted."} FVC: ***L FEV1: ***L, ***% predicted FEV1/FVC ratio: ***% Interpretation: {Blank single:19197::"Spirometry consistent with mild obstructive disease","Spirometry consistent with moderate obstructive disease","Spirometry consistent with severe obstructive disease","Spirometry consistent with possible restrictive disease","Spirometry consistent with mixed obstructive and restrictive disease","Spirometry uninterpretable due to technique","Spirometry consistent with normal pattern","No overt abnormalities noted given today's efforts"}.  Please see scanned spirometry results for details.  Skin Testing: {Blank single:19197::"Select foods","Environmental allergy panel","Environmental allergy panel and select foods","Food allergy panel","None","Deferred due to recent antihistamines use"}. *** Results discussed with patient/family.   Medication List:  Current Outpatient Medications  Medication Sig Dispense Refill  . albuterol (VENTOLIN HFA) 108 (90 Base) MCG/ACT inhaler Inhale 2 puffs into the lungs every 6 (six) hours as needed for wheezing or shortness of breath.    Marland Kitchen BREO ELLIPTA 100-25 MCG/ACT AEPB INHALE 1 PUFF INTO THE LUNGS DAILY. RINSE MOUTH AFTER USE 60 each 3  . CALCIUM PO Take 1 tablet by mouth daily.    . citalopram (CELEXA) 20 MG tablet TAKE 1 TABLET(20 MG) BY MOUTH DAILY 90 tablet 3  . cyclobenzaprine (FLEXERIL) 5 MG tablet Take 1 tablet (5 mg total) by mouth at bedtime. 30 tablet 0  . estradiol (VIVELLE-DOT) 0.075 MG/24HR Place 1 patch onto the skin 2 (two) times a week. Changes patch on tuesdays and fridays 24 patch 4  . fluticasone (FLONASE SENSIMIST) 27.5 MCG/SPRAY nasal spray Place 1 spray into the nose daily.    Nyoka Cowden Tea, Camellia sinensis, (GREEN TEA PO) Take by mouth daily.    Marland Kitchen loratadine (CLARITIN) 10 MG tablet Take 10 mg by mouth at bedtime.    . methocarbamol (ROBAXIN) 500 MG tablet Take  1 tablet (500 mg total) by mouth every 8 (eight) hours as needed for muscle spasms. 15 tablet 0  . montelukast (SINGULAIR) 10 MG tablet TAKE 1 TABLET(10 MG) BY MOUTH AT BEDTIME 90 tablet 3  . naproxen sodium (ALEVE) 220 MG tablet Take 220 mg by mouth daily as needed (pain/headache).    . NON FORMULARY Take by mouth daily. Lion's mane    . progesterone (PROMETRIUM) 100 MG capsule Take 1 capsule (100 mg total) by mouth at bedtime. 90 capsule 4  . VITAMIN D PO Take by mouth daily.     No current facility-administered medications for this visit.   Allergies: Allergies  Allergen Reactions  . Pollen Extract Cough, Itching and Shortness Of Breath  . Dust Mite Extract Itching  . Latex     Other reaction(s): itching  . Tree Extract Itching   I reviewed her past medical history, social history, family history, and environmental history and no significant changes have been reported from her previous visit.  Review of Systems  Constitutional:  Negative for appetite change, chills, fever and unexpected weight change.  HENT:  Negative for congestion and rhinorrhea.   Eyes:  Negative for itching.  Respiratory:  Negative for cough, chest tightness, shortness of breath and wheezing.   Cardiovascular:  Negative for chest pain.  Gastrointestinal:  Negative for abdominal pain.  Genitourinary:  Negative for difficulty urinating.  Skin:  Positive for rash.  Allergic/Immunologic: Positive for environmental allergies.  Neurological:  Negative for headaches.   Objective: LMP  12/31/2017  There is no height or weight on file to calculate BMI. Physical Exam Vitals and nursing note reviewed.  Constitutional:      Appearance: Normal appearance. She is well-developed.  HENT:     Head: Normocephalic and atraumatic.     Right Ear: Tympanic membrane and external ear normal.     Left Ear: Tympanic membrane and external ear normal.     Nose: Nose normal.     Mouth/Throat:     Mouth: Mucous membranes are  moist.     Pharynx: Oropharynx is clear.  Eyes:     Conjunctiva/sclera: Conjunctivae normal.  Cardiovascular:     Rate and Rhythm: Normal rate and regular rhythm.     Heart sounds: Normal heart sounds. No murmur heard.    No friction rub. No gallop.  Pulmonary:     Effort: Pulmonary effort is normal.     Breath sounds: Normal breath sounds. No wheezing, rhonchi or rales.  Musculoskeletal:     Cervical back: Neck supple.  Skin:    General: Skin is warm.     Findings: Rash present.     Comments: Small erythematous patch on right corner of the lip.  Neurological:     Mental Status: She is alert and oriented to person, place, and time.  Psychiatric:        Behavior: Behavior normal.  Previous notes and tests were reviewed. The plan was reviewed with the patient/family, and all questions/concerned were addressed.  It was my pleasure to see Tiea today and participate in her care. Please feel free to contact me with any questions or concerns.  Sincerely,  Rexene Alberts, DO Allergy & Immunology  [facility] office: [place] office: [phone]

## 2022-10-06 ENCOUNTER — Other Ambulatory Visit: Payer: Self-pay

## 2022-10-06 ENCOUNTER — Ambulatory Visit: Payer: BC Managed Care – PPO | Admitting: Allergy

## 2022-10-06 ENCOUNTER — Encounter: Payer: Self-pay | Admitting: Allergy

## 2022-10-06 VITALS — BP 114/70 | HR 75 | Temp 98.4°F | Resp 12 | Wt 154.8 lb

## 2022-10-06 DIAGNOSIS — J3089 Other allergic rhinitis: Secondary | ICD-10-CM | POA: Diagnosis not present

## 2022-10-06 DIAGNOSIS — T781XXD Other adverse food reactions, not elsewhere classified, subsequent encounter: Secondary | ICD-10-CM

## 2022-10-06 DIAGNOSIS — J4540 Moderate persistent asthma, uncomplicated: Secondary | ICD-10-CM | POA: Diagnosis not present

## 2022-10-06 DIAGNOSIS — R079 Chest pain, unspecified: Secondary | ICD-10-CM | POA: Diagnosis not present

## 2022-10-06 LAB — HM MAMMOGRAPHY

## 2022-10-06 MED ORDER — ALBUTEROL SULFATE HFA 108 (90 BASE) MCG/ACT IN AERS: 2.0000 | INHALATION_SPRAY | RESPIRATORY_TRACT | 1 refills | Status: DC | PRN

## 2022-10-06 MED ORDER — MONTELUKAST SODIUM 10 MG PO TABS: ORAL_TABLET | ORAL | 1 refills | Status: DC

## 2022-10-06 MED ORDER — ODACTRA 12 SQ-HDM SL SUBL: 1.0000 | SUBLINGUAL_TABLET | Freq: Every day | SUBLINGUAL | 5 refills | Status: AC

## 2022-10-06 MED ORDER — ARNUITY ELLIPTA 100 MCG/ACT IN AEPB: 1.0000 | INHALATION_SPRAY | Freq: Every day | RESPIRATORY_TRACT | 5 refills | Status: DC

## 2022-10-06 NOTE — Assessment & Plan Note (Signed)
Past history - Noted perioral pruritus with soy and fresh apples. Chobani yogurt and leftover fish caused dizziness in the past. Noted increased allergic symptoms after red meat consumption. Alpha gal in the past was negative. 2023 skin testing showed: Negative to soy and apple. 2023 bloodwork negative to alpha gal. Interim history -  no reactions.  Continue to avoid foods that are bothersome - soy, fresh apples, chobani yogurt, old fish. Limit red meat.

## 2022-10-06 NOTE — Assessment & Plan Note (Signed)
Past history - Daily symptoms with chest tightness, wheezing, coughing.  Currently on Singulair daily.  Used to be on Qvar in the past but still had symptoms.  Had COVID-19 twice. 2023 spirometry showed some restriction with 6% improvement FEV1 postbronchodilator treatment.  Clinically feeling improved. Interim history - only used albuterol during respiratory infection. No prednisone.  Today's spirometry was normal. Daily controller medication(s): start Arnuity 152mg 1 puff once a day and rinse mouth after each use.  Stop Breo.   Let me know if it's not covered.  Continue Singulair (montelukast) '10mg'$  daily at night. May use albuterol rescue inhaler 2 puffs every 4 to 6 hours as needed for shortness of breath, chest tightness, coughing, and wheezing. May use albuterol rescue inhaler 2 puffs 5 to 15 minutes prior to strenuous physical activities. Monitor frequency of use.  Get spirometry at next visit.

## 2022-10-06 NOTE — Assessment & Plan Note (Signed)
Noted left sided chest pains due to stress at work. Had similar symptoms 1 year ago after family death and work up was negative at that time. There is family history of cardiovascular disease. Follow up with PCP regarding this.

## 2022-10-06 NOTE — Patient Instructions (Addendum)
Environmental allergies 2023 skin testing was positive to dust mites.  Continue environmental control measures as below. Use over the counter antihistamines such as Zyrtec (cetirizine), Claritin (loratadine), Allegra (fexofenadine), or Xyzal (levocetirizine) daily as needed. May take twice a day during allergy flares. May switch antihistamines every few months. Continue Singulair (montelukast) '10mg'$  daily at night. May take Flonase Sensamist nasal spray 1 spray per nostril 1-2 times a day as needed for nasal congestion.  Nasal saline spray (i.e., Simply Saline) or nasal saline lavage (i.e., NeilMed) is recommended as needed and prior to medicated nasal sprays. Read upon Odactra sublingual immunotherapy. First dose given in office with 30 minute wait then it's once a day dosing at home.  [email or web address] Let me know if not covered.   Breathing Daily controller medication(s): start Arnuity 133mg 1 puff once a day and rinse mouth after each use.  Stop Breo for now.  Let me know if it's not covered.  Continue Singulair (montelukast) '10mg'$  daily at night. May use albuterol rescue inhaler 2 puffs every 4 to 6 hours as needed for shortness of breath, chest tightness, coughing, and wheezing. May use albuterol rescue inhaler 2 puffs 5 to 15 minutes prior to strenuous physical activities. Monitor frequency of use.  Asthma control goals:  Full participation in all desired activities (may need albuterol before activity) Albuterol use two times or less a week on average (not counting use with activity) Cough interfering with sleep two times or less a month Oral steroids no more than once a year No hospitalizations   Food 2023 skin testing: Negative to soy and apple.  Continue to avoid foods that are bothersome - soy, fresh apples, chobani yogurt, old fish. Limit red meat.  Chest pain Follow up with PCP - get at least an EKG.   Follow up in 3-4 months or sooner if needed.  Control of House  Dust Mite Allergen Dust mite allergens are a common trigger of allergy and asthma symptoms. While they can be found throughout the house, these microscopic creatures thrive in warm, humid environments such as bedding, upholstered furniture and carpeting. Because so much time is spent in the bedroom, it is essential to reduce mite levels there.  Encase pillows, mattresses, and box springs in special allergen-proof fabric covers or airtight, zippered plastic covers.  Bedding should be washed weekly in hot water (130 F) and dried in a hot dryer. Allergen-proof covers are available for comforters and pillows that can't be regularly washed.  Wash the allergy-proof covers every few months. Minimize clutter in the bedroom. Keep pets out of the bedroom.  Keep humidity less than 50% by using a dehumidifier or air conditioning. You can buy a humidity measuring device called a hygrometer to monitor this.  If possible, replace carpets with hardwood, linoleum, or washable area rugs. If that's not possible, vacuum frequently with a vacuum that has a HEPA filter. Remove all upholstered furniture and non-washable window drapes from the bedroom. Remove all non-washable stuffed toys from the bedroom.  Wash stuffed toys weekly.

## 2022-10-06 NOTE — Assessment & Plan Note (Signed)
Past history - Perennial rhinoconjunctivitis symptoms for 30+ years with worsening in the spring.  Tried Singulair, Flonase, Claritin with some benefit. No prior AIT. 2023 skin testing showed: Positive to dust mites.  Interim history - nasal congestion but not using daily Flonase.  Continue environmental control measures as below. Use over the counter antihistamines such as Zyrtec (cetirizine), Claritin (loratadine), Allegra (fexofenadine), or Xyzal (levocetirizine) daily as needed. May take twice a day during allergy flares. May switch antihistamines every few months. Continue Singulair (montelukast) '10mg'$  daily at night. May take Flonase Sensamist nasal spray 1 spray per nostril 1-2 times a day as needed for nasal congestion.  Nasal saline spray (i.e., Simply Saline) or nasal saline lavage (i.e., NeilMed) is recommended as needed and prior to medicated nasal sprays. Read about Kathlene November sublingual immunotherapy. First dose given in office with 30 minute wait then it's once a day dosing at home.  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

## 2022-10-07 ENCOUNTER — Telehealth: Payer: Self-pay

## 2022-10-07 ENCOUNTER — Encounter: Payer: Self-pay | Admitting: Allergy

## 2022-10-07 MED ORDER — PULMICORT FLEXHALER 90 MCG/ACT IN AEPB: 2.0000 | INHALATION_SPRAY | Freq: Every day | RESPIRATORY_TRACT | 5 refills | Status: DC

## 2022-10-07 NOTE — Telephone Encounter (Signed)
PA request received via CMM for Odactra 12SQ-HDM sublingual tablets  PA has been submitted to [place] and has been APPROVED from 10/06/2022-10/06/2023.  Key: [id number]

## 2022-10-20 ENCOUNTER — Ambulatory Visit: Payer: BC Managed Care – PPO | Admitting: Family Medicine

## 2022-10-20 VITALS — BP 116/76 | HR 75 | Temp 98.2°F | Ht 61.0 in | Wt 153.6 lb

## 2022-10-20 DIAGNOSIS — R079 Chest pain, unspecified: Secondary | ICD-10-CM | POA: Diagnosis not present

## 2022-10-20 DIAGNOSIS — R0789 Other chest pain: Secondary | ICD-10-CM | POA: Diagnosis not present

## 2022-10-20 NOTE — Progress Notes (Signed)
Established Patient Office Visit  Subjective   Patient ID: Shelly Combs, female    DOB: Nov 23, 1965  Age: 57 y.o. MRN: [id number]  Chief Complaint  Patient presents with   Chest Pain    Patient complains of intermittent left sided chest pain, x1 year, Patient reports Dyspnea and chest pressure     HPI   Shelly Combs is seen with some atypical chest pain really for the past year substernally and left-sided chest.  This is generally not exertional.  She had tremendous stress this past year.  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  Her husband also teaches there.  She describes occasional heavy pressure discomfort which can last for hours and even sometimes days.  Some increased fatigue.  No radiation of pain.  She walks some for exercise but has not had any consistent exertional chest pain.  Non-smoker.  No history of diabetes.  Does have history of mildly elevated LDL cholesterol 155 with most recent labs.  Her mother had history of atrial fibrillation.  She had a maternal grandmother that reportedly had MI around age 38.  Patient denies any appetite changes.  She states that both she and her husband have been drinking more wine this past year usually about a half bottle per day.  No active GERD symptoms.  Past Medical History:  Diagnosis Date   Allergy    Asthma    Cardiac arrhythmia due to congenital heart disease    Chronic back pain    Uterine fibroid    Past Surgical History:  Procedure Laterality Date   GANGLION CYST EXCISION  1980   HYSTEROSCOPY  1999   uterine fibroids    reports that she has never smoked. She has never been exposed to tobacco smoke. She has never used smokeless tobacco. She reports current alcohol use of about 2.0 standard drinks of alcohol per week. She reports that she does not use drugs. family history includes Arthritis in an other family member; Atrial fibrillation in her mother; Cancer in her paternal grandfather and another  family member; Heart disease in an other family member; Mental illness in an other family member. Allergies  Allergen Reactions   Pollen Extract Cough, Itching and Shortness Of Breath   Dust Mite Extract Itching   Latex     Other reaction(s): itching   Tree Extract Itching    Review of Systems  Constitutional:  Negative for malaise/fatigue and weight loss.  Eyes:  Negative for blurred vision.  Respiratory:  Negative for cough, hemoptysis and shortness of breath.   Cardiovascular:  Positive for chest pain. Negative for palpitations, claudication and PND.  Gastrointestinal:  Negative for heartburn.  Neurological:  Negative for dizziness, loss of consciousness, weakness and headaches.      Objective:     BP 116/76 (BP Location: Left Arm, Patient Position: Sitting, Cuff Size: Normal)   Pulse 75   Temp 98.2 F (36.8 C) (Oral)   Ht 5' 1"$  (1.549 m)   Wt 153 lb 9.6 oz (69.7 kg)   LMP 12/31/2017   SpO2 99%   BMI 29.02 kg/m    Physical Exam Vitals reviewed.  Constitutional:      Appearance: She is well-developed.  Cardiovascular:     Rate and Rhythm: Normal rate and regular rhythm.     Heart sounds: Normal heart sounds. No murmur heard. Pulmonary:     Effort: Pulmonary effort is normal.     Breath sounds: Normal  breath sounds. No wheezing.  Neurological:     Mental Status: She is alert.      No results found for any visits on 10/20/22.    The 10-year ASCVD risk score (Arnett DK, et al., 2019) is: 2%    Assessment & Plan:   Problem List Items Addressed This Visit   None Visit Diagnoses     Atypical chest pain    -  Primary   Relevant Orders   EKG 12-Lead (Completed)   CT CARDIAC SCORING (SELF PAY ONLY)     Leahrose presents with 1 year history of intermittent chest pain.  This sounds more atypical and less likely cardiac.  Her calculated 10-year risk of CAD is only 2%.  She does have history of moderately elevated LDL cholesterol.  No recent exertional  symptoms.  Increased situational stress as above.  -We did discuss possible coronary calcium score for further restratification and she would like to consider that.  Suspect this will probably be low. -Continue low saturated fat diet -We talked about nonpharmacologic ways of trying to manage stress.  She will continue walking as long as she is not having associated chest tightness or symptoms with this  No follow-ups on file.    Carolann Littler, MD

## 2022-10-27 ENCOUNTER — Other Ambulatory Visit: Payer: Self-pay | Admitting: Family Medicine

## 2022-10-30 ENCOUNTER — Other Ambulatory Visit: Payer: Self-pay | Admitting: Family Medicine

## 2022-11-03 MED ORDER — PULMICORT FLEXHALER 180 MCG/ACT IN AEPB: 1.0000 | INHALATION_SPRAY | Freq: Every day | RESPIRATORY_TRACT | 5 refills | Status: DC

## 2022-11-03 NOTE — Addendum Note (Signed)
Addended by: Garnet Sierras on: 11/03/2022 02:13 PM   Modules accepted: Orders

## 2022-12-12 ENCOUNTER — Ambulatory Visit: Payer: BC Managed Care – PPO | Admitting: Obstetrics & Gynecology

## 2022-12-12 ENCOUNTER — Encounter: Payer: Self-pay | Admitting: Obstetrics & Gynecology

## 2022-12-12 VITALS — BP 114/70 | HR 75

## 2022-12-12 DIAGNOSIS — N644 Mastodynia: Secondary | ICD-10-CM

## 2022-12-12 DIAGNOSIS — Z79890 Hormone replacement therapy (postmenopausal): Secondary | ICD-10-CM | POA: Diagnosis not present

## 2022-12-12 MED ORDER — ESTRADIOL 0.05 MG/24HR TD PTTW: 1.0000 | MEDICATED_PATCH | TRANSDERMAL | 4 refills | Status: DC

## 2022-12-12 NOTE — Progress Notes (Signed)
    ZEPLYN VROMAN 1965/10/16 [id number]        57 y.o.  G0P0000   RP: Bilateral breast tenderness and enlargement  HPI: Bilateral breast tenderness and enlargement.  No breast lump felt.  No change in skin and no nipple discharge. Postmenopause on HRT.  Estradiol patch increased from 0.05 to 0.075 in 03/2022.  Mammo Benign in 09/2022.  On Celexa 20 mg PO daily.  Weight gain.   OB History  Gravida Para Term Preterm AB Living  0 0 0 0 0 0  SAB IAB Ectopic Multiple Live Births  0 0 0 0 0    Past medical history,surgical history, problem list, medications, allergies, family history and social history were all reviewed and documented in the EPIC chart.   Directed ROS with pertinent positives and negatives documented in the history of present illness/assessment and plan.  Exam:  Vitals:   12/12/22 0802  BP: 114/70  Pulse: 75  SpO2: 97%   General appearance:  Normal  Breast exam:  Normal bilateral breast exam, non tender.  No nodule or mass felt.  Skin normal.  No nipple discharge.  Axillae normal, no LN felt.   Assessment/Plan:  57 y.o. G0P0000   1. Bilateral Breast tenderness in female Bilateral breast tenderness and enlargement.  No breast lump felt.  No change in skin and no nipple discharge. Postmenopause on HRT.  Estradiol patch increased from 0.05 to 0.075 in 03/2022.  Mammo Benign in 09/2022.  On Celexa 20 mg PO daily.  Weight gain. Breast exam:  Normal bilateral breast exam, non tender.  No nodule or mass felt.  Skin normal.  No nipple discharge.  Axillae normal, no LN felt.  Will contact me if no improvement in breast tenderness after decreasing her Estradiol patch to 0.05 twice a week.  2. Postmenopausal hormone replacement therapy Bilateral breast tenderness probably d/t the increase in Estradiol patch dosage.  Decision to go back down to Estradiol patch 0.05 twice a week.  Will contact her family physician to manage her anti-depressant, would like to switch from Celexa to  Wellbutrin.  Other orders - estradiol (VIVELLE-DOT) 0.05 MG/24HR patch; Place 1 patch (0.05 mg total) onto the skin 2 (two) times a week.   Genia Del MD, 8:37 AM 12/12/2022

## 2023-01-06 ENCOUNTER — Ambulatory Visit (INDEPENDENT_AMBULATORY_CARE_PROVIDER_SITE_OTHER): Payer: BC Managed Care – PPO | Admitting: Family Medicine

## 2023-01-06 ENCOUNTER — Encounter: Payer: Self-pay | Admitting: Family Medicine

## 2023-01-06 VITALS — BP 114/76 | HR 69 | Temp 98.2°F | Wt 150.4 lb

## 2023-01-06 DIAGNOSIS — L710 Perioral dermatitis: Secondary | ICD-10-CM | POA: Diagnosis not present

## 2023-01-06 MED ORDER — DOXYCYCLINE HYCLATE 100 MG PO CAPS: 100.0000 mg | ORAL_CAPSULE | Freq: Two times a day (BID) | ORAL | 0 refills | Status: DC

## 2023-01-06 NOTE — Progress Notes (Signed)
   Subjective:    Patient ID: Shelly Combs, female    DOB: 1965/09/26, 57 y.o.   MRN: [id number]  HPI Here for an itchy rash around the mouth that started one week ago. She says this is exactly like the rash she was treated twice for in the past few years. This was diagnosed as a dermatitis, and she responded to courses of Doxycycline both times.    Review of Systems  Constitutional: Negative.   Respiratory: Negative.    Cardiovascular: Negative.   Skin:  Positive for rash.       Objective:   Physical Exam Constitutional:      Appearance: Normal appearance.  Cardiovascular:     Rate and Rhythm: Normal rate and regular rhythm.     Pulses: Normal pulses.     Heart sounds: Normal heart sounds.  Pulmonary:     Effort: Pulmonary effort is normal.     Breath sounds: Normal breath sounds.  Skin:    Comments: There is a faint macular red rash around the mouth with a few small pustules   Neurological:     Mental Status: She is alert.           Assessment & Plan:  Perioral dermatitis, treat with 30 days of Doxycycline.  Gershon Crane, MD

## 2023-01-25 ENCOUNTER — Other Ambulatory Visit: Payer: Self-pay | Admitting: Family Medicine

## 2023-03-21 IMAGING — DX DG CHEST 2V
2 series · 2 of 2 positions shown · non-contrast
Comparison: None.

CLINICAL DATA: Persistent cough for several weeks.

EXAM:
CHEST - 2 VIEW

[chest pa]
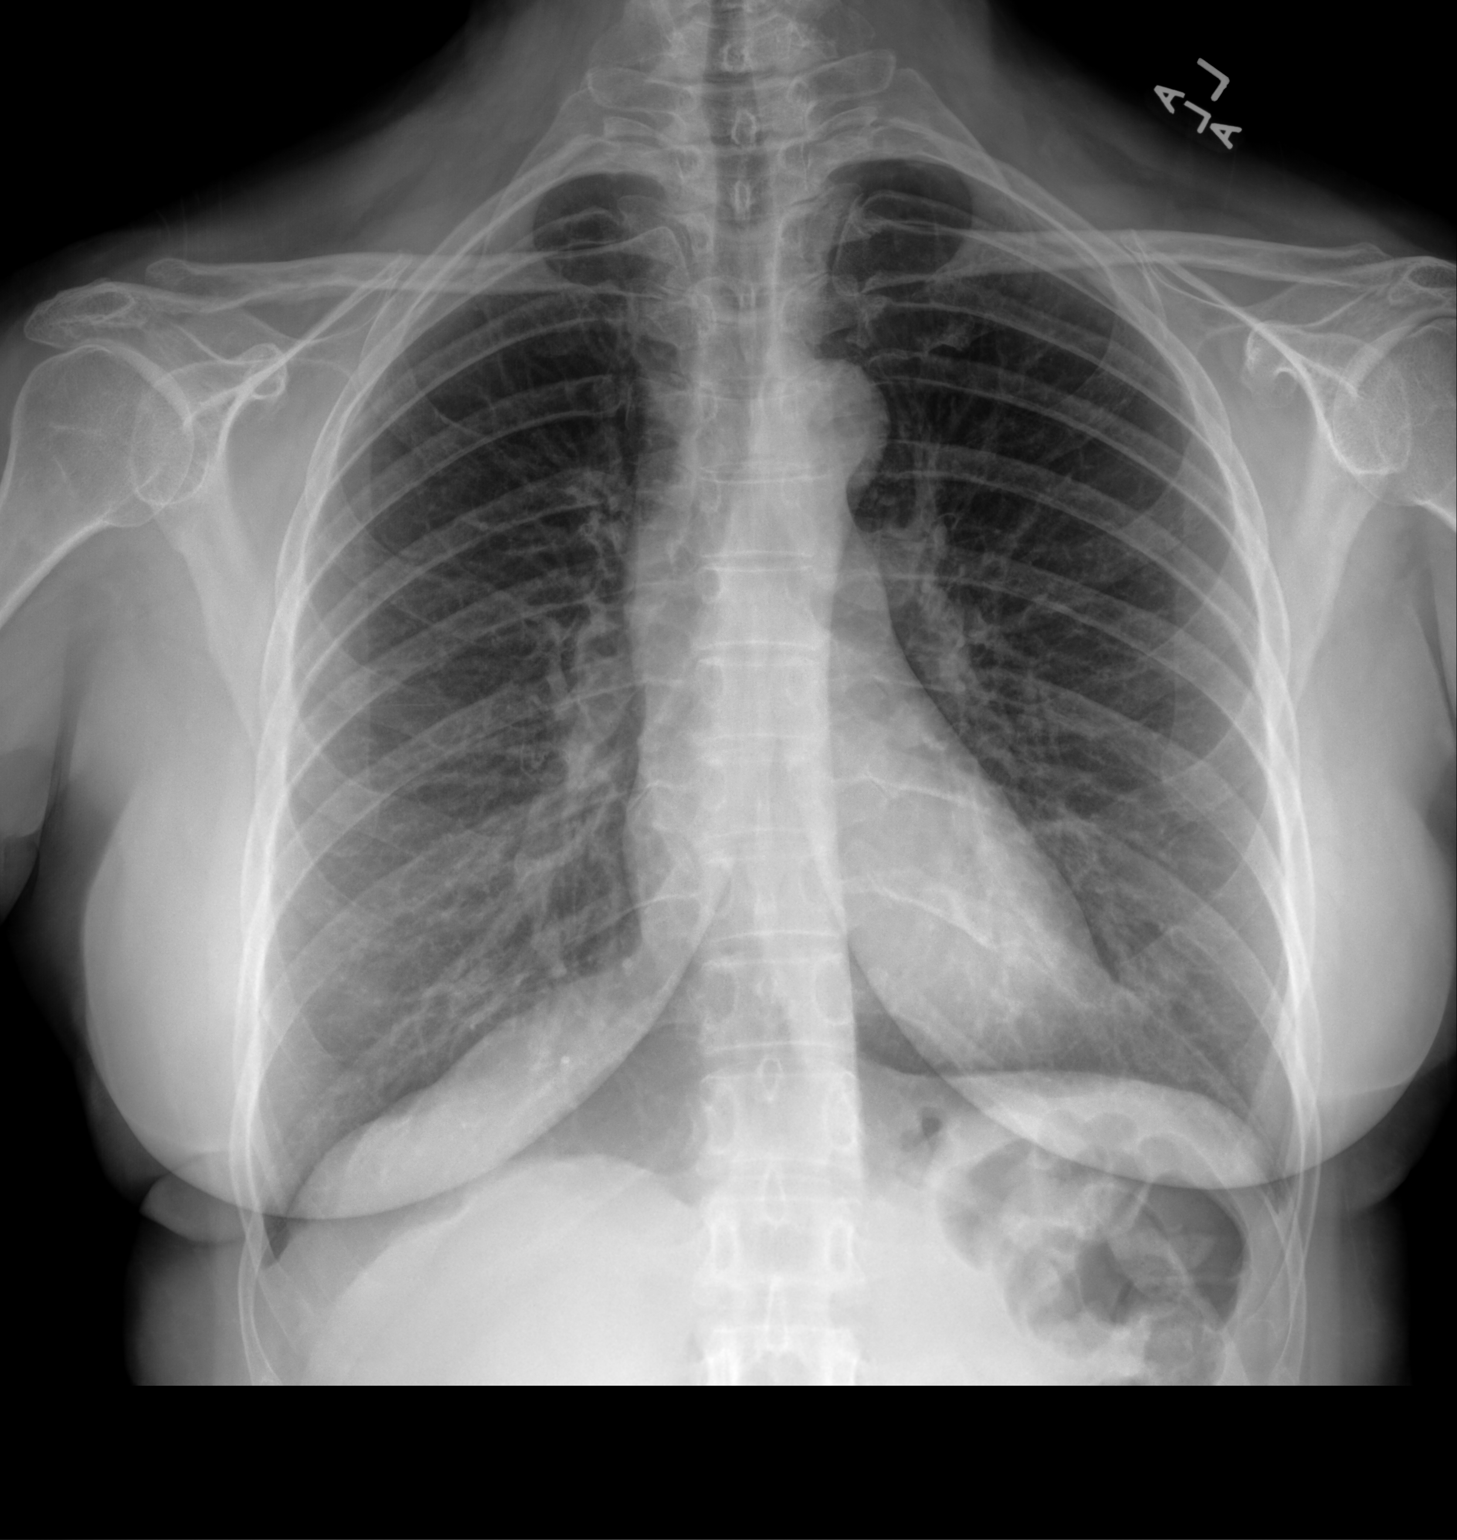

[chest lat]
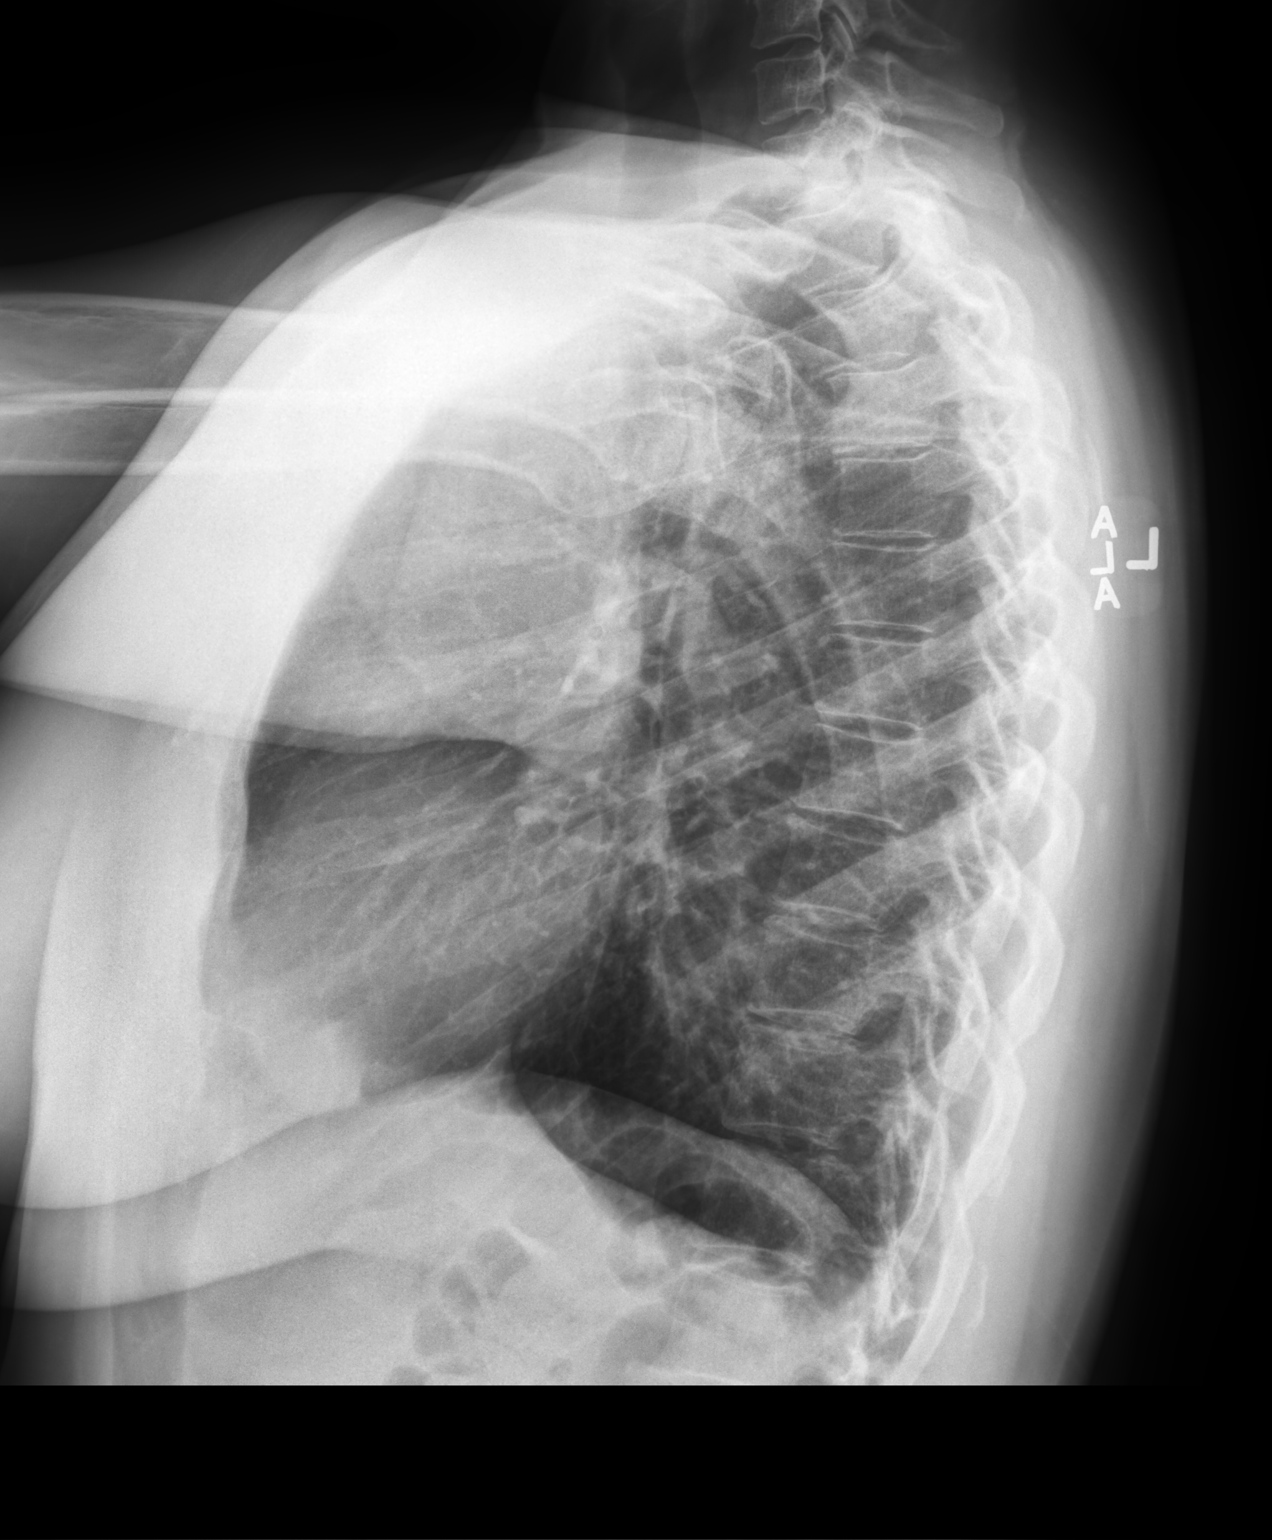

[2 of 2 positions shown; findings below may reference images not displayed]

FINDINGS: The heart size and mediastinal contours are within normal limits.
There is no evidence of pulmonary edema, consolidation,
pneumothorax, nodule or pleural fluid. The visualized skeletal
structures are unremarkable.
IMPRESSION: No active cardiopulmonary disease.

## 2023-04-15 IMAGING — CR DG CHEST 2V
2 series · 2 of 2 positions shown · non-contrast
Comparison: 06/18/2021

CLINICAL DATA: Left sided chest pain for 2 days

EXAM:
CHEST - 2 VIEW

[chest pa]
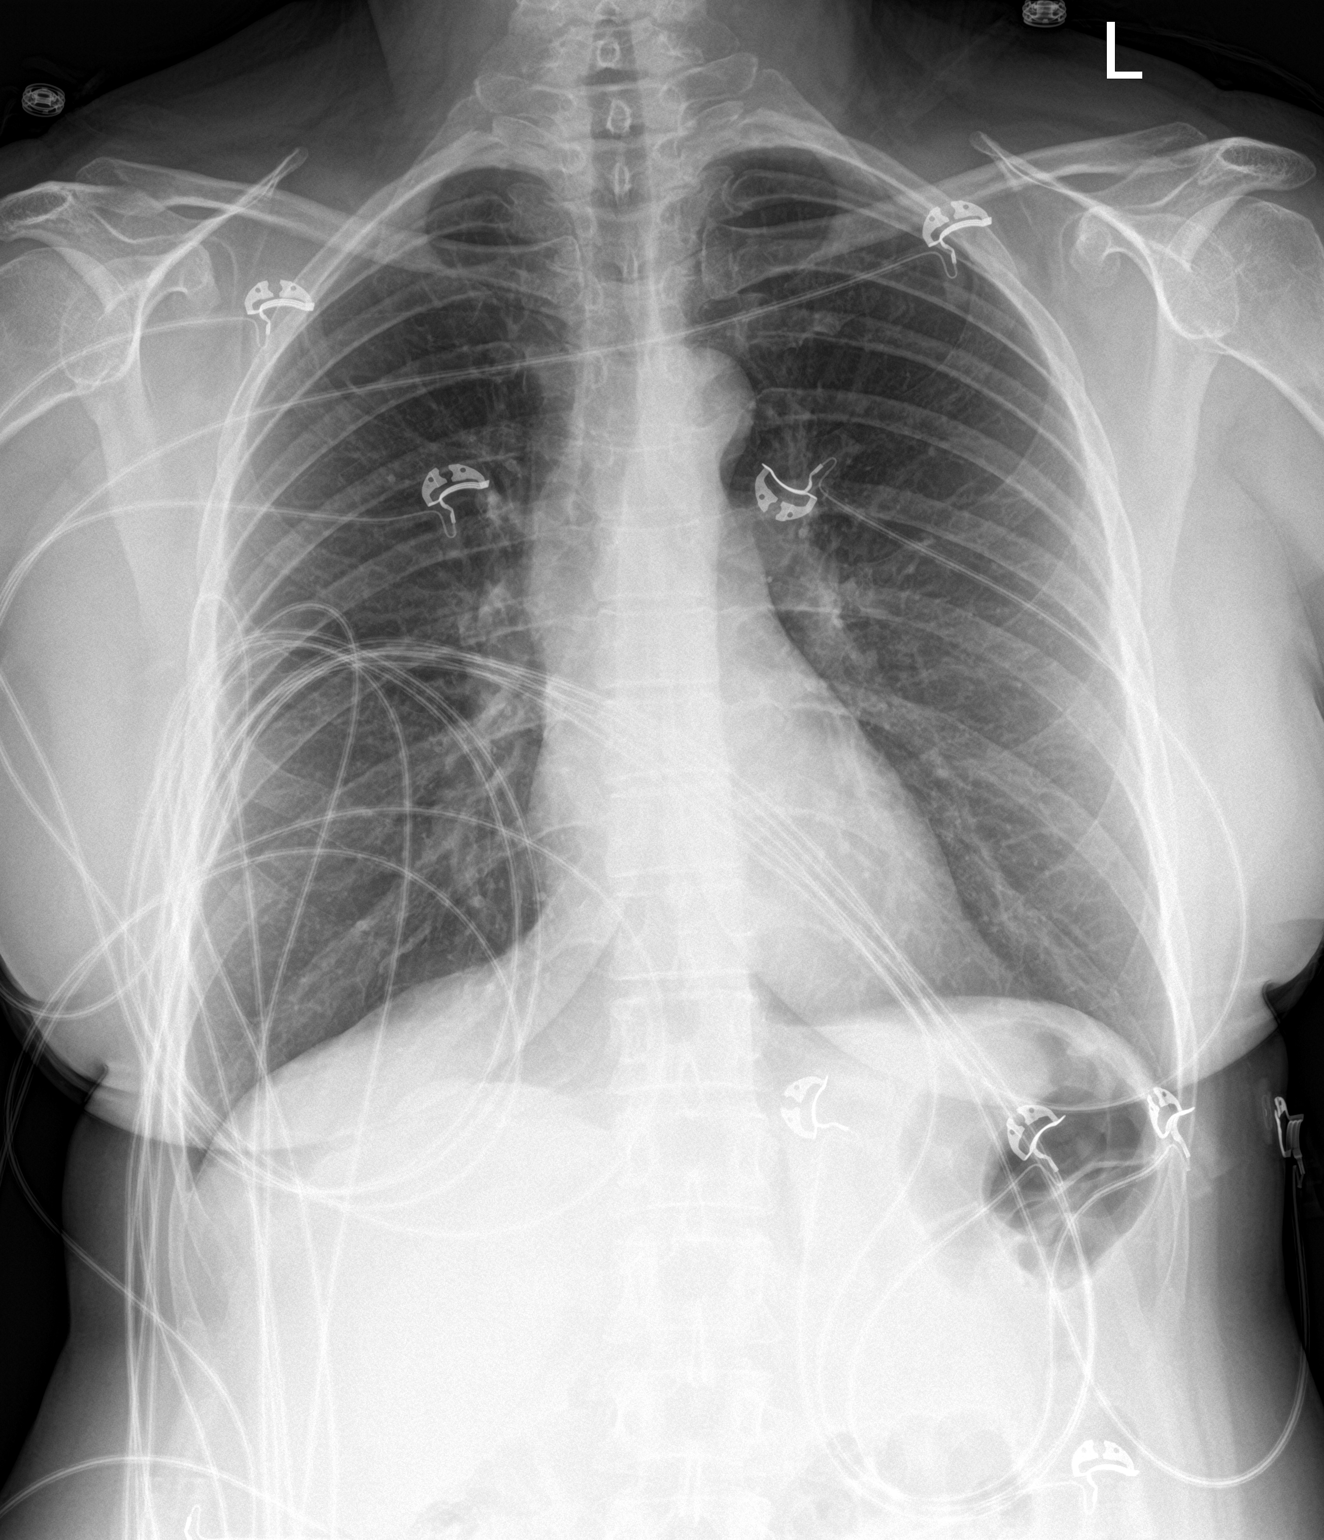

[chest lat]
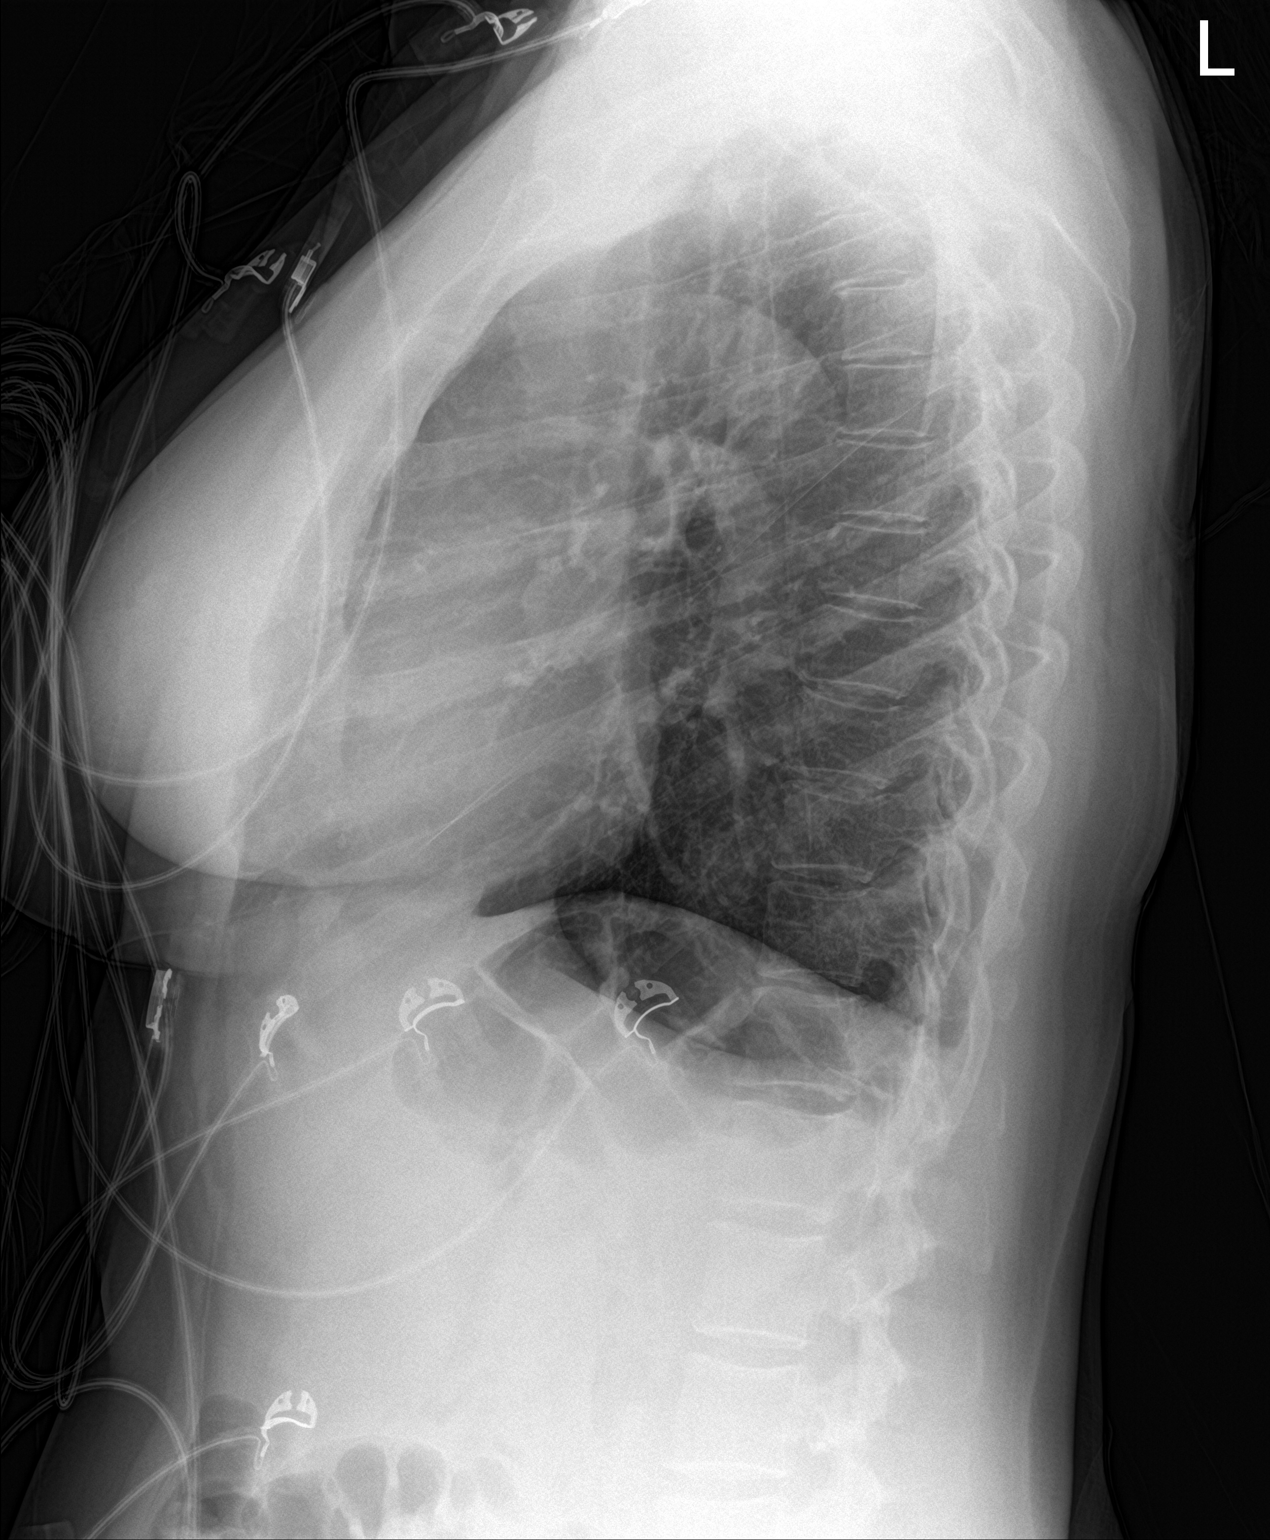

[2 of 2 positions shown; findings below may reference images not displayed]

FINDINGS: The heart size and mediastinal contours are within normal limits.
Both lungs are clear. The visualized skeletal structures are
unremarkable.
IMPRESSION: No active cardiopulmonary disease.

## 2023-04-24 ENCOUNTER — Ambulatory Visit: Payer: BC Managed Care – PPO | Admitting: Family Medicine

## 2023-04-24 ENCOUNTER — Encounter: Payer: Self-pay | Admitting: Family Medicine

## 2023-04-24 VITALS — BP 122/70 | HR 78 | Temp 98.2°F | Ht 61.0 in | Wt 155.9 lb

## 2023-04-24 DIAGNOSIS — Z1211 Encounter for screening for malignant neoplasm of colon: Secondary | ICD-10-CM

## 2023-04-24 DIAGNOSIS — R635 Abnormal weight gain: Secondary | ICD-10-CM | POA: Diagnosis not present

## 2023-04-24 DIAGNOSIS — I358 Other nonrheumatic aortic valve disorders: Secondary | ICD-10-CM

## 2023-04-24 NOTE — Progress Notes (Signed)
Established Patient Office Visit  Subjective   Patient ID: Shelly Combs, female    DOB: 10/22/1965  Age: 57 y.o. MRN: [id number]  Chief Complaint  Patient presents with   Medical Management of Chronic Issues    HPI   Shelly Combs has history of asthma, perennial allergic rhinitis, chronic anxiety. She has actually gone back to school recently studying social work.  She has been very pleased and excited about her studies.  Has had a very stressful year though overall.  She had several deaths in the family not long ago and her husband's had a lot of stress with his work as a Airline pilot at [employer] as well.   Currently on Celexa 20 mg daily.  Her main issue though has been chronic anxiety versus depression.  She has been frustrated with her recent weight gain.  Just got back from a trip to [place] and admits eating poorly during that trip.  She specifically has questions regarding whether it would be difficult for her to lose weight on medication such as Celexa.  She also had questions regarding GLP-1 medications.  She does not have any history of diabetes.  No other major comorbidities.  BMI around 29.  Denies any recent chest pains, dizziness, or dyspnea.  Walks some for exercise.  Patient also wishing to discuss colon cancer screening.  She specifically is asking for Cologuard.  She denies any family history of colon cancer or any first-degree relatives.  Past Medical History:  Diagnosis Date   Allergy    Asthma    Cardiac arrhythmia due to congenital heart disease    Chronic back pain    Uterine fibroid    Past Surgical History:  Procedure Laterality Date   GANGLION CYST EXCISION  1980   HYSTEROSCOPY  1999   uterine fibroids    reports that she has never smoked. She has never been exposed to tobacco smoke. She has never used smokeless tobacco. She reports current alcohol use of about 2.0 standard drinks of alcohol per week. She reports that she does not use drugs. family history includes  Arthritis in an other family member; Atrial fibrillation in her mother; Cancer in her paternal grandfather and another family member; Heart disease in an other family member; Mental illness in an other family member. Allergies  Allergen Reactions   Pollen Extract Cough, Itching and Shortness Of Breath   Dust Mite Extract Itching   Latex     Other reaction(s): itching   Tree Extract Itching    Review of Systems  Constitutional:  Negative for chills, fever and weight loss.  Respiratory:  Negative for cough and shortness of breath.   Cardiovascular:  Negative for chest pain, palpitations and leg swelling.  Neurological:  Negative for dizziness and loss of consciousness.      Objective:     BP 122/70 (BP Location: Left Arm, Patient Position: Sitting, Cuff Size: Normal)   Pulse 78   Temp 98.2 F (36.8 C) (Oral)   Ht 5\' 1"  (1.549 m)   Wt 155 lb 14.4 oz (70.7 kg)   LMP 12/31/2017 Comment: sexually active  SpO2 98%   BMI 29.46 kg/m    Physical Exam Vitals reviewed.  Constitutional:      General: She is not in acute distress.    Appearance: Normal appearance. She is not ill-appearing.  Cardiovascular:     Rate and Rhythm: Normal rate and regular rhythm.     Heart sounds: Murmur heard.  Comments: Soft 1/6 to 2/6 systolic murmur noted right upper sternal border of the aortic valve region.  Cannot appreciate any change with inspiration or expiration.  Somewhat less pronounced supine Pulmonary:     Effort: Pulmonary effort is normal.     Breath sounds: Normal breath sounds. No wheezing or rales.  Neurological:     Mental Status: She is alert.      No results found for any visits on 04/24/23.    The 10-year ASCVD risk score (Arnett DK, et al., 2019) is: 2.2%    Assessment & Plan:   #1 colon cancer screening.  Patient requesting Cologuard.  She has not had any history of polyps or first-degree relatives with colon cancer.  We discussed limitations of Cologuard with  sensitivity and specificity and she agrees to proceed with this.  This was ordered  #2 overweight.  BMI around 29.  Discussed strategies for weight loss.  Recommend reducing overall carbs and increasing exercise.  Could also consider some occasional intermittent fasting.  We discussed importance of maintaining lean body mass by getting adequate protein. She did inquire regarding GLP-1 medications but she has no history of diabetes and doubt insurance would cover with her current BMI of 29  #3 heart murmur aortic valve.  Not auscultated previously.  She denies any concerning symptoms such as dizziness, syncope, or dyspnea.  Set up echocardiogram to further assess.  She is not aware of any family history of aortic valve issues but mom has some sort of heart history and she will try to get more details  No follow-ups on file.    Evelena Peat, MD

## 2023-04-29 ENCOUNTER — Encounter: Payer: Self-pay | Admitting: Family Medicine

## 2023-05-01 ENCOUNTER — Ambulatory Visit (HOSPITAL_COMMUNITY): Payer: BC Managed Care – PPO | Attending: Internal Medicine

## 2023-05-01 DIAGNOSIS — I358 Other nonrheumatic aortic valve disorders: Secondary | ICD-10-CM

## 2023-05-01 LAB — ECHOCARDIOGRAM COMPLETE
AV Vena cont: 0.2 cm
Area-P 1/2: 5.13 cm2
Calc EF: 62.8 %
P 1/2 time: 425 msec
Radius: 0.25 cm
S' Lateral: 2.7 cm
Single Plane A2C EF: 64.9 %
Single Plane A4C EF: 58.1 %

## 2023-05-06 ENCOUNTER — Ambulatory Visit: Payer: BC Managed Care – PPO | Admitting: Family Medicine

## 2023-05-06 ENCOUNTER — Encounter: Payer: Self-pay | Admitting: Family Medicine

## 2023-05-06 VITALS — BP 106/70 | HR 71 | Temp 97.9°F | Ht 61.0 in | Wt 152.0 lb

## 2023-05-06 DIAGNOSIS — I351 Nonrheumatic aortic (valve) insufficiency: Secondary | ICD-10-CM | POA: Diagnosis not present

## 2023-05-06 NOTE — Progress Notes (Signed)
Established Patient Office Visit  Subjective   Patient ID: MAHVEEN LEEDER, female    DOB: 02/19/1966  Age: 57 y.o. MRN: [id number]  Chief Complaint  Patient presents with   Results    HPI   Shelly Combs is seen to discuss recent echocardiogram.  She was here for visit a couple weeks ago and we auscultated a murmur that had not been detected previously over the aortic valve area.  This seemed more prominent with standing.  Her echocardiogram showed moderate aortic insufficiency.  No aortic valve stenosis.  Only mild mitral regurgitation.  EF 60 to 65%.  No regional wall motion abnormalities.  No aortic root dilatation.  She states her mother had atrial fibrillation but no known valvular problems.  She has a son with with Parkinson White syndrome.  Grandmother died of congestive heart failure age 44 but apparently had coronary artery disease issues  Shanya has already made some dietary changes and has lost a few pounds since last week.  She has no history of exercise intolerance.  No history of dizziness, dyspnea, or syncope with exercise.  Past Medical History:  Diagnosis Date   Allergy    Asthma    Cardiac arrhythmia due to congenital heart disease    Chronic back pain    Uterine fibroid    Past Surgical History:  Procedure Laterality Date   GANGLION CYST EXCISION  1980   HYSTEROSCOPY  1999   uterine fibroids    reports that she has never smoked. She has never been exposed to tobacco smoke. She has never used smokeless tobacco. She reports current alcohol use of about 2.0 standard drinks of alcohol per week. She reports that she does not use drugs. family history includes Arthritis in an other family member; Atrial fibrillation in her mother; Cancer in her paternal grandfather and another family member; Heart disease in an other family member; Mental illness in an other family member. Allergies  Allergen Reactions   Pollen Extract Cough, Itching and Shortness Of Breath   Dust Mite  Extract Itching   Latex     Other reaction(s): itching   Tree Extract Itching    Review of Systems  Constitutional:  Negative for fever and malaise/fatigue.  Eyes:  Negative for blurred vision.  Respiratory:  Negative for shortness of breath.   Cardiovascular:  Negative for chest pain.  Neurological:  Negative for dizziness, weakness and headaches.      Objective:     BP 106/70 (BP Location: Left Arm, Patient Position: Sitting, Cuff Size: Normal)   Pulse 71   Temp 97.9 F (36.6 C) (Oral)   Ht 5\' 1"  (1.549 m)   Wt 152 lb (68.9 kg)   LMP 12/31/2017 Comment: sexually active  SpO2 99%   BMI 28.72 kg/m  BP Readings from Last 3 Encounters:  05/06/23 106/70  04/24/23 122/70  01/06/23 114/76   Wt Readings from Last 3 Encounters:  05/06/23 152 lb (68.9 kg)  04/24/23 155 lb 14.4 oz (70.7 kg)  01/06/23 150 lb 6.4 oz (68.2 kg)      Physical Exam Vitals reviewed.  Constitutional:      Appearance: Normal appearance.  Cardiovascular:     Rate and Rhythm: Normal rate and regular rhythm.     Heart sounds: Murmur heard.     Comments: 2/6 systolic murmur aortic valve area slightly more prominent with standing. Pulmonary:     Effort: Pulmonary effort is normal.     Breath sounds: Normal breath sounds. No  wheezing or rales.  Neurological:     Mental Status: She is alert.      No results found for any visits on 05/06/23.  Last CBC Lab Results  Component Value Date   WBC 7.4 07/12/2021   HGB 13.7 07/12/2021   HCT 42.0 07/12/2021   MCV 95.0 07/12/2021   MCH 31.0 07/12/2021   RDW 11.6 07/12/2021   PLT 275 07/12/2021   Last metabolic panel Lab Results  Component Value Date   GLUCOSE 95 07/12/2021   NA 140 07/12/2021   K 4.1 07/12/2021   CL 109 07/12/2021   CO2 24 07/12/2021   BUN 13 07/12/2021   CREATININE 0.87 07/12/2021   GFRNONAA >60 07/12/2021   CALCIUM 9.1 07/12/2021   PROT 6.4 (L) 07/12/2021   ALBUMIN 3.9 07/12/2021   BILITOT 0.5 07/12/2021   ALKPHOS  58 07/12/2021   AST 23 07/12/2021   ALT 24 07/12/2021   ANIONGAP 7 07/12/2021   Last lipids Lab Results  Component Value Date   CHOL 239 (H) 04/03/2021   HDL 68.40 04/03/2021   LDLCALC 155 (H) 04/03/2021   TRIG 76.0 04/03/2021   CHOLHDL 3 04/03/2021   Last hemoglobin A1c No results found for: "HGBA1C" Last thyroid functions Lab Results  Component Value Date   TSH 2.34 11/01/2019      The 10-year ASCVD risk score (Arnett DK, et al., 2019) is: 1.7%    Assessment & Plan:   Heart murmur secondary to moderate aortic valve regurgitation.  Patient currently asymptomatic.  No evidence for aortic root dilatation.  Will set up cardiology referral to establish care.  We reviewed her recent echo and answered questions as best we could  -We have encouraged her to continue with weight loss efforts and discussed healthy strategies. -Continue regular exercise as tolerated -Follow-up promptly for any new symptoms such as dizziness, dyspnea, or syncope -We spent 30 minutes reviewing recent echo and answering questions regarding her abnormal finding and addressing multiple questions she had regarding exercise and lifestyle change  Evelena Peat, MD

## 2023-05-06 NOTE — Patient Instructions (Signed)
I will set up cardiology referral to follow and advise regarding your murmur.

## 2023-05-09 ENCOUNTER — Other Ambulatory Visit: Payer: Self-pay | Admitting: Obstetrics & Gynecology

## 2023-05-09 DIAGNOSIS — Z79890 Hormone replacement therapy (postmenopausal): Secondary | ICD-10-CM

## 2023-05-12 NOTE — Telephone Encounter (Signed)
Med refill request: HRT Last AEX: 04/01/2022 Next AEX: nothing scheduled, recall sent per EMR.  Last MMG (if hormonal med): 10/06/2022-birads 2; Cat C Refill authorized: rx pend  Last refill per request on 02/06/2023. Msg sent to appt desk.

## 2023-05-13 NOTE — Telephone Encounter (Signed)
Melton Alar, CMA Left message for patient to call and schedule appointment.

## 2023-05-17 LAB — COLOGUARD: COLOGUARD: NEGATIVE

## 2023-05-18 ENCOUNTER — Encounter: Payer: Self-pay | Admitting: Family Medicine

## 2023-05-19 MED ORDER — CITALOPRAM HYDROBROMIDE 20 MG PO TABS: ORAL_TABLET | ORAL | 3 refills | Status: DC

## 2023-05-19 MED ORDER — PROGESTERONE MICRONIZED 100 MG PO CAPS: 100.0000 mg | ORAL_CAPSULE | Freq: Every day | ORAL | 0 refills | Status: DC

## 2023-05-19 MED ORDER — MONTELUKAST SODIUM 10 MG PO TABS: ORAL_TABLET | ORAL | 3 refills | Status: DC

## 2023-05-20 NOTE — Telephone Encounter (Signed)
Pt has not yet scheduled appt.   Will refuse refill request for now and note that pt needs to schedule appt.   Routing to provider for final review and closing encounter.

## 2023-05-27 ENCOUNTER — Other Ambulatory Visit: Payer: Self-pay

## 2023-05-27 NOTE — Telephone Encounter (Signed)
Opened in error

## 2023-06-04 ENCOUNTER — Telehealth: Payer: Self-pay | Admitting: Family Medicine

## 2023-06-04 NOTE — Telephone Encounter (Signed)
Requesting an exception for her husband and son to be added as new patients.

## 2023-06-14 NOTE — Progress Notes (Unsigned)
Cardiology Office Note:    Date:  06/15/2023   ID:  [name], DOB 03-01-1966, MRN [id number]  PCP:  Shelly Covey, MD  Cardiologist:  Shelly Ishikawa, MD  Electrophysiologist:  None   Referring MD: Shelly Covey, MD   Chief Complaint  Patient presents with   Cardiac Valve Problem    History of Present Illness:    Shelly Combs is a 57 y.o. female with a hx of asthma who is referred by Shelly Combs for evaluation of aortic regurgitation.  Echocardiogram 05/01/2023 showed EF 60 to 65%, normal RV function, moderate aortic regurgitation, mild mitral regurgitation.  Reports she had ED visit for chest pain in 07/2021.  Reports pain that was sharp, can occur at rest or with exertion.  She has continued to have similar pain since that time, occurs about once per month.  Also reports she gets short of breath with exertion.  Does have history of asthma.  She recently started working out at gym with a Systems analyst.  Also has done Pilates 1-2 times per week for 1 year.  Had an episode of lightheadedness over the summer, denies any syncope.  Also had some edema over the summer but has resolved.  Reports having palpitations where feels like heart is fluttering in chest, occurs couple times per week and last for short duration.  No smoking history.  Family history includes mother has A-fib and maternal grandmother had obstructive CAD.    Past Medical History:  Diagnosis Date   Allergy    Asthma    Cardiac arrhythmia due to congenital heart disease    Chronic back pain    Uterine fibroid     Past Surgical History:  Procedure Laterality Date   GANGLION CYST EXCISION  1980   HYSTEROSCOPY  1999   uterine fibroids    Current Medications: Current Meds  Medication Sig   albuterol (VENTOLIN HFA) 108 (90 Base) MCG/ACT inhaler Inhale 2 puffs into the lungs every 4 (four) hours as needed for wheezing or shortness of breath (coughing fits).   budesonide (PULMICORT FLEXHALER)  180 MCG/ACT inhaler Inhale 1 puff into the lungs daily. Rinse mouth after each use.   CALCIUM PO Take 1 tablet by mouth daily.   citalopram (CELEXA) 20 MG tablet TAKE 1 TABLET(20 MG) BY MOUTH DAILY   Dust Mite Mixed Allergen Ext (ODACTRA) 12 SQ-HDM SUBL Place 1 tablet under the tongue daily. First dose must be taken in the allergy office. (Patient taking differently: Place 1 tablet under the tongue as needed. First dose must be taken in the allergy office.)   estradiol (VIVELLE-DOT) 0.05 MG/24HR patch Place 1 patch (0.05 mg total) onto the skin 2 (two) times a week.   fluticasone (FLONASE SENSIMIST) 27.5 MCG/SPRAY nasal spray Place 1 spray into the nose daily.   Green Tea, Camellia sinensis, (GREEN TEA PO) Take by mouth daily.   loratadine (CLARITIN) 10 MG tablet Take 10 mg by mouth at bedtime.   metoprolol tartrate (LOPRESSOR) 50 MG tablet Take 1 tablet (50 mg total) by mouth once for 1 dose. TAKE 2 HOURS PRIOR TO CARDIAC TEST   montelukast (SINGULAIR) 10 MG tablet TAKE 1 TABLET(10 MG) BY MOUTH AT BEDTIME   progesterone (PROMETRIUM) 100 MG capsule Take 1 capsule (100 mg total) by mouth at bedtime.   VITAMIN D PO Take by mouth daily.     Allergies:   Pollen extract, Dust mite extract, Latex, and Tree extract   Social History  Socioeconomic History   Marital status: Married    Spouse name: Not on file   Number of children: Not on file   Years of education: Not on file   Highest education level: Doctorate  Occupational History   Not on file  Tobacco Use   Smoking status: Combs    Passive exposure: Combs   Smokeless tobacco: Combs  Vaping Use   Vaping status: Combs Used  Substance and Sexual Activity   Alcohol use: Yes    Alcohol/week: 2.0 standard drinks of alcohol    Types: 2 Glasses of wine per week    Comment: 2 glasses of wine a night    Drug use: No   Sexual activity: Yes    Partners: Male    Birth control/protection: Post-menopausal    Comment: 1st intercourse- 19,  partners- more than 5  Other Topics Concern   Not on file  Social History Narrative   Not on file   Social Determinants of Health   Financial Resource Strain: Low Risk  (10/20/2022)   Overall Financial Resource Strain (CARDIA)    Difficulty of Paying Living Expenses: Not hard at all  Food Insecurity: No Food Insecurity (10/20/2022)   Hunger Vital Sign    Worried About Running Out of Food in the Last Year: Combs true    Ran Out of Food in the Last Year: Combs true  Transportation Needs: No Transportation Needs (10/20/2022)   PRAPARE - Administrator, Civil Service (Medical): No    Lack of Transportation (Non-Medical): No  Physical Activity: Insufficiently Active (10/20/2022)   Exercise Vital Sign    Days of Exercise per Week: 7 days    Minutes of Exercise per Session: 20 min  Stress: Stress Concern Present (10/20/2022)   [employer] of Occupational Health - Occupational Stress Questionnaire    Feeling of Stress : To some extent  Social Connections: Socially Integrated (10/20/2022)   Social Connection and Isolation Panel [NHANES]    Frequency of Communication with Friends and Family: Three times a week    Frequency of Social Gatherings with Friends and Family: Once a week    Attends Religious Services: 1 to 4 times per year    Active Member of [employer] or Organizations: Yes    Attends Engineer, structural: More than 4 times per year    Marital Status: Married     Family History: The patient's family history includes Arthritis in an other family member; Atrial fibrillation in her mother; Cancer in her paternal grandfather and another family member; Heart disease in an other family member; Mental illness in an other family member.  ROS:   Please see the history of present illness.     All other systems reviewed and are negative.  EKGs/Labs/Other Studies Reviewed:    The following studies were reviewed today:   EKG:   06/15/23: Normal sinus rhythm, Q waves  V1/2, rate 74  Recent Labs: No results found for requested labs within last 365 days.  Recent Lipid Panel    Component Value Date/Time   CHOL 239 (H) 04/03/2021 0957   TRIG 76.0 04/03/2021 0957   HDL 68.40 04/03/2021 0957   CHOLHDL 3 04/03/2021 0957   VLDL 15.2 04/03/2021 0957   LDLCALC 155 (H) 04/03/2021 0957    Physical Exam:    VS:  BP 116/80 (BP Location: Left Arm, Patient Position: Sitting, Cuff Size: Normal)   Pulse 74   Ht 5\' 1"  (1.549 m)   Wt  152 lb 9.6 oz (69.2 kg)   LMP 12/31/2017 Comment: sexually active  SpO2 98%   BMI 28.83 kg/m     Wt Readings from Last 3 Encounters:  06/15/23 152 lb 9.6 oz (69.2 kg)  05/06/23 152 lb (68.9 kg)  04/24/23 155 lb 14.4 oz (70.7 kg)     GEN:  Well nourished, well developed in no acute distress HEENT: Normal NECK: No JVD; No carotid bruits LYMPHATICS: No lymphadenopathy CARDIAC: RRR, no murmurs, rubs, gallops RESPIRATORY:  Clear to auscultation without rales, wheezing or rhonchi  ABDOMEN: Soft, non-tender, non-distended MUSCULOSKELETAL:  No edema; No deformity  SKIN: Warm and dry NEUROLOGIC:  Alert and oriented x 3 PSYCHIATRIC:  Normal affect   ASSESSMENT:    1. Chest pain of uncertain etiology   2. Moderate aortic valve regurgitation   3. Palpitations   4. Hyperlipidemia, unspecified hyperlipidemia type   5. Precordial pain    PLAN:    Chest pain: Atypical in description but also having dyspnea on exertion that could represent anginal equivalent.  Does have CAD risk factors (age, hyperlipidemia).  Recommend coronary CTA to evaluate for obstructive CAD.  Will give Lopressor 50 mg prior to study  Palpitations: Description concerning for arrhythmia, evaluate with Zio patch x 7 days  Aortic regurgitation: Moderate AI on echo 04/2023.  Repeat echocardiogram in 1 year to monitor  Hyperlipidemia: LDL 155 on 04/03/2021.  Check lipid panel.  Will follow-up results coronary CTA to guide aggressive to be in lowering  cholesterol.  RTC in 4 months  Medication Adjustments/Labs and Tests Ordered: Current medicines are reviewed at length with the patient today.  Concerns regarding medicines are outlined above.  Orders Placed This Encounter  Procedures   CT CORONARY MORPH W/CTA COR W/SCORE W/CA W/CM &/OR WO/CM   Basic Metabolic Panel (BMET)   Lipid panel   LONG TERM MONITOR (3-14 DAYS)   EKG 12-Lead   Meds ordered this encounter  Medications   metoprolol tartrate (LOPRESSOR) 50 MG tablet    Sig: Take 1 tablet (50 mg total) by mouth once for 1 dose. TAKE 2 HOURS PRIOR TO CARDIAC TEST    Dispense:  1 tablet    Refill:  0    Patient Instructions  Medication Instructions:  Continue all  current medications  *If you need a refill on your cardiac medications before your next appointment, please call your pharmacy*   Lab Work: BMET, Lipid today  If you have labs (blood work) drawn today and your tests are completely normal, you will receive your results only by: MyChart Message (if you have MyChart) OR A paper copy in the mail If you have any lab test that is abnormal or we need to change your treatment, we will call you to review the results.   Testing/Procedures: Your physician has recommended that you wear a holter monitor/ 7 day zio. Holter monitors are medical devices that record the heart's electrical activity. Doctors most often use these monitors to diagnose arrhythmias. Arrhythmias are problems with the speed or rhythm of the heartbeat. The monitor is a small, portable device. You can wear one while you do your normal daily activities. This is usually used to diagnose what is causing palpitations/syncope (passing out).   Schedule at [facility] Your physician has requested that you have coronary  CTA. Coronary computed tomography (CT)angiogram  is a special type of CT scan that uses a computer to produce multi-dimensional views of major blood vessels throughout the heart.  CT angiography,  a contrast material is injected through an IV to help visualize the blood vessels  a painless test that uses an x-ray machine to take clear, detailed pictures of your heart arteries .  Please follow instruction sheet as given.   Follow-Up: At [facility], you and your health needs are our priority.  As part of our continuing mission to provide you with exceptional heart care, we have created designated Provider Care Teams.  These Care Teams include your primary Cardiologist (physician) and Advanced Practice Providers (APPs -  Physician Assistants and Nurse Practitioners) who all work together to provide you with the care you need, when you need it.  We recommend signing up for the patient portal called "MyChart".  Sign up information is provided on this After Visit Summary.  MyChart is used to connect with patients for Virtual Visits (Telemedicine).  Patients are able to view lab/test results, encounter notes, upcoming appointments, etc.  Non-urgent messages can be sent to your provider as well.   To learn more about what you can do with MyChart, go to [email or web address].    Your next appointment:   4 month(s)  Provider:   Little Ishikawa, MD     Other:  Christena Deem- Long Term Monitor Instructions  Your physician has requested you wear a ZIO patch monitor for 7 days.  This is a single patch monitor. [employer] supplies one patch monitor per enrollment. Additional stickers are not available. Please do not apply patch if you will be having a Nuclear Stress Test,  Echocardiogram, Cardiac CT, MRI, or Chest Xray during the period you would be wearing the  monitor. The patch cannot be worn during these tests. You cannot remove and re-apply the  ZIO XT patch monitor.  Your ZIO patch monitor will be mailed 3 day USPS to your address on file. It may take 3-5 days  to receive your monitor after you have been enrolled.  Once you have received your monitor, please review the enclosed  instructions. Your monitor  has already been registered assigning a specific monitor serial # to you.  Billing and Patient Assistance Program Information  We have supplied [employer] with any of your insurance information on file for billing purposes. [employer] offers a sliding scale Patient Assistance Program for patients that do not have  insurance, or whose insurance does not completely cover the cost of the ZIO monitor.  You must apply for the Patient Assistance Program to qualify for this discounted rate.  To apply, please call [employer] at [phone], select option 4, select option 2, ask to apply for  Patient Assistance Program. Meredeth Ide will ask your household income, and how many people  are in your household. They will quote your out-of-pocket cost based on that information.  [employer] will also be able to set up a 34-month, interest-free payment plan if needed.  Applying the monitor   Shave hair from upper left chest.  Hold abrader disc by orange tab. Rub abrader in 40 strokes over the upper left chest as  indicated in your monitor instructions.  Clean area with 4 enclosed alcohol pads. Let dry.  Apply patch as indicated in monitor instructions. Patch will be placed under collarbone on left  side of chest with arrow pointing upward.  Rub patch adhesive wings for 2 minutes. Remove white label marked "1". Remove the white  label marked "2". Rub patch adhesive wings for 2 additional minutes.  While looking in a mirror, press and release button in center of  patch. A small green light will  flash 3-4 times. This will be your only indicator that the monitor has been turned on.  Do not shower for the first 24 hours. You may shower after the first 24 hours.  Press the button if you feel a symptom. You will hear a small click. Record Date, Time and  Symptom in the Patient Logbook.  When you are ready to remove the patch, follow instructions on the last 2 pages of Patient  Logbook. Stick patch  monitor onto the last page of Patient Logbook.  Place Patient Logbook in the blue and white box. Use locking tab on box and tape box closed  securely. The blue and white box has prepaid postage on it. Please place it in the mailbox as  soon as possible. Your physician should have your test results approximately 7 days after the  monitor has been mailed back to [facility].  Call [facility] Customer Care at [phone] if you have questions regarding  your ZIO XT patch monitor. Call them immediately if you see an orange light blinking on your  monitor.  If your monitor falls off in less than 4 days, contact our Monitor department at [phone].  If your monitor becomes loose or falls off after 4 days call [employer] at [phone] for  suggestions on securing your monitor       Your cardiac CT will be scheduled at one of the below locations:   [facility] [address] [place], [place] [address] [phone]    Please arrive at the [facility] Entrance (Entrance C2) of [facility] 30 minutes prior to test start time. You can use the FREE valet parking offered at entrance C (encouraged to control the heart rate for the test)  Proceed to the [facility] Radiology Department (first floor) to check-in and test prep.  All radiology patients and guests should use entrance C2 at [facility], accessed from [facility], even though the hospital's physical address listed is [address].     Please follow these instructions carefully (unless otherwise directed):  An IV will be required for this test and Nitroglycerin will be given.  BMP today  On the Night Before the Test: Be sure to Drink plenty of water. Do not consume any caffeinated/decaffeinated beverages or chocolate 12 hours prior to your test. Do not take any antihistamines 12 hours prior to your test.   On the Day of the Test: Drink plenty of water  until 1 hour prior to the test. Do not eat any food 1 hour prior to test. You may take your regular medications prior to the test.  Take metoprolol (Lopressor)  50mg  two hours prior to test. FEMALES- please wear underwire-free bra if available, avoid dresses & tight clothing  After the Test: Drink plenty of water. After receiving IV contrast, you may experience a mild flushed feeling. This is normal. On occasion, you may experience a mild rash up to 24 hours after the test. This is not dangerous. If this occurs, you can take Benadryl 25 mg and increase your fluid intake. If you experience trouble breathing, this can be serious. If it is severe call 911 IMMEDIATELY. If it is mild, please call our office. .  We will call to schedule your test 2-4 weeks out understanding that some insurance companies will need an authorization prior to the service being performed.   For more information and frequently asked questions, please  visit our website : [email or web address]  For non-scheduling related questions, please contact the cardiac imaging nurse navigator should you have any questions/concerns: Cardiac Imaging Nurse Navigators Direct Office Dial: [phone]   For scheduling needs, including cancellations and rescheduling, please call [place], [phone].      Signed, Shelly Ishikawa, MD  06/15/2023 1:12 PM    [facility]

## 2023-06-15 ENCOUNTER — Encounter: Payer: Self-pay | Admitting: Cardiology

## 2023-06-15 ENCOUNTER — Ambulatory Visit: Payer: BC Managed Care – PPO | Attending: Cardiology

## 2023-06-15 ENCOUNTER — Ambulatory Visit: Payer: BC Managed Care – PPO | Attending: Cardiology | Admitting: Cardiology

## 2023-06-15 VITALS — BP 116/80 | HR 74 | Ht 61.0 in | Wt 152.6 lb

## 2023-06-15 DIAGNOSIS — I351 Nonrheumatic aortic (valve) insufficiency: Secondary | ICD-10-CM | POA: Diagnosis not present

## 2023-06-15 DIAGNOSIS — R072 Precordial pain: Secondary | ICD-10-CM

## 2023-06-15 DIAGNOSIS — E785 Hyperlipidemia, unspecified: Secondary | ICD-10-CM | POA: Diagnosis not present

## 2023-06-15 DIAGNOSIS — R002 Palpitations: Secondary | ICD-10-CM

## 2023-06-15 DIAGNOSIS — R079 Chest pain, unspecified: Secondary | ICD-10-CM

## 2023-06-15 MED ORDER — METOPROLOL TARTRATE 50 MG PO TABS: 50.0000 mg | ORAL_TABLET | Freq: Once | ORAL | 0 refills | Status: AC

## 2023-06-15 NOTE — Patient Instructions (Addendum)
Medication Instructions:  Continue all  current medications  *If you need a refill on your cardiac medications before your next appointment, please call your pharmacy*   Lab Work: BMET, Lipid today  If you have labs (blood work) drawn today and your tests are completely normal, you will receive your results only by: MyChart Message (if you have MyChart) OR A paper copy in the mail If you have any lab test that is abnormal or we need to change your treatment, we will call you to review the results.   Testing/Procedures: Your physician has recommended that you wear a holter monitor/ 7 day zio. Holter monitors are medical devices that record the heart's electrical activity. Doctors most often use these monitors to diagnose arrhythmias. Arrhythmias are problems with the speed or rhythm of the heartbeat. The monitor is a small, portable device. You can wear one while you do your normal daily activities. This is usually used to diagnose what is causing palpitations/syncope (passing out).   Schedule at [facility] Your physician has requested that you have coronary  CTA. Coronary computed tomography (CT)angiogram  is a special type of CT scan that uses a computer to produce multi-dimensional views of major blood vessels throughout the heart.  CT angiography, a contrast material is injected through an IV to help visualize the blood vessels  a painless test that uses an x-ray machine to take clear, detailed pictures of your heart arteries .  Please follow instruction sheet as given.   Follow-Up: At [facility], you and your health needs are our priority.  As part of our continuing mission to provide you with exceptional heart care, we have created designated Provider Care Teams.  These Care Teams include your primary Cardiologist (physician) and Advanced Practice Providers (APPs -  Physician Assistants and Nurse Practitioners) who all work together to provide you with the care you need, when  you need it.  We recommend signing up for the patient portal called "MyChart".  Sign up information is provided on this After Visit Summary.  MyChart is used to connect with patients for Virtual Visits (Telemedicine).  Patients are able to view lab/test results, encounter notes, upcoming appointments, etc.  Non-urgent messages can be sent to your provider as well.   To learn more about what you can do with MyChart, go to [email or web address].    Your next appointment:   4 month(s)  Provider:   Little Ishikawa, MD     Other:  Christena Deem- Long Term Monitor Instructions  Your physician has requested you wear a ZIO patch monitor for 7 days.  This is a single patch monitor. [employer] supplies one patch monitor per enrollment. Additional stickers are not available. Please do not apply patch if you will be having a Nuclear Stress Test,  Echocardiogram, Cardiac CT, MRI, or Chest Xray during the period you would be wearing the  monitor. The patch cannot be worn during these tests. You cannot remove and re-apply the  ZIO XT patch monitor.  Your ZIO patch monitor will be mailed 3 day USPS to your address on file. It may take 3-5 days  to receive your monitor after you have been enrolled.  Once you have received your monitor, please review the enclosed instructions. Your monitor  has already been registered assigning a specific monitor serial # to you.  Billing and Patient Assistance Program Information  We have supplied [employer] with any of your insurance information on file for billing purposes. [employer] offers a  sliding scale Patient Assistance Program for patients that do not have  insurance, or whose insurance does not completely cover the cost of the ZIO monitor.  You must apply for the Patient Assistance Program to qualify for this discounted rate.  To apply, please call [employer] at [phone], select option 4, select option 2, ask to apply for  Patient Assistance Program. Meredeth Ide will  ask your household income, and how many people  are in your household. They will quote your out-of-pocket cost based on that information.  [employer] will also be able to set up a 50-month, interest-free payment plan if needed.  Applying the monitor   Shave hair from upper left chest.  Hold abrader disc by orange tab. Rub abrader in 40 strokes over the upper left chest as  indicated in your monitor instructions.  Clean area with 4 enclosed alcohol pads. Let dry.  Apply patch as indicated in monitor instructions. Patch will be placed under collarbone on left  side of chest with arrow pointing upward.  Rub patch adhesive wings for 2 minutes. Remove white label marked "1". Remove the white  label marked "2". Rub patch adhesive wings for 2 additional minutes.  While looking in a mirror, press and release button in center of patch. A small green light will  flash 3-4 times. This will be your only indicator that the monitor has been turned on.  Do not shower for the first 24 hours. You may shower after the first 24 hours.  Press the button if you feel a symptom. You will hear a small click. Record Date, Time and  Symptom in the Patient Logbook.  When you are ready to remove the patch, follow instructions on the last 2 pages of Patient  Logbook. Stick patch monitor onto the last page of Patient Logbook.  Place Patient Logbook in the blue and white box. Use locking tab on box and tape box closed  securely. The blue and white box has prepaid postage on it. Please place it in the mailbox as  soon as possible. Your physician should have your test results approximately 7 days after the  monitor has been mailed back to [facility].  Call [facility] Customer Care at [phone] if you have questions regarding  your ZIO XT patch monitor. Call them immediately if you see an orange light blinking on your  monitor.  If your monitor falls off in less than 4 days, contact our Monitor department at  [phone].  If your monitor becomes loose or falls off after 4 days call [employer] at [phone] for  suggestions on securing your monitor       Your cardiac CT will be scheduled at one of the below locations:   [facility] [address] [place], [place] [address] [phone]    Please arrive at the [facility] and Children's Entrance (Entrance C2) of [facility] 30 minutes prior to test start time. You can use the FREE valet parking offered at entrance C (encouraged to control the heart rate for the test)  Proceed to the [facility] Radiology Department (first floor) to check-in and test prep.  All radiology patients and guests should use entrance C2 at [facility], accessed from [facility], even though the hospital's physical address listed is [address].     Please follow these instructions carefully (unless otherwise directed):  An IV will be required for this test and Nitroglycerin will be given.  BMP today  On  the Night Before the Test: Be sure to Drink plenty of water. Do not consume any caffeinated/decaffeinated beverages or chocolate 12 hours prior to your test. Do not take any antihistamines 12 hours prior to your test.   On the Day of the Test: Drink plenty of water until 1 hour prior to the test. Do not eat any food 1 hour prior to test. You may take your regular medications prior to the test.  Take metoprolol (Lopressor)  50mg  two hours prior to test. FEMALES- please wear underwire-free bra if available, avoid dresses & tight clothing  After the Test: Drink plenty of water. After receiving IV contrast, you may experience a mild flushed feeling. This is normal. On occasion, you may experience a mild rash up to 24 hours after the test. This is not dangerous. If this occurs, you can take Benadryl 25 mg and increase your fluid intake. If you experience trouble breathing, this can be serious. If it is  severe call 911 IMMEDIATELY. If it is mild, please call our office. .  We will call to schedule your test 2-4 weeks out understanding that some insurance companies will need an authorization prior to the service being performed.   For more information and frequently asked questions, please visit our website : [email or web address]  For non-scheduling related questions, please contact the cardiac imaging nurse navigator should you have any questions/concerns: Cardiac Imaging Nurse Navigators Direct Office Dial: [phone]   For scheduling needs, including cancellations and rescheduling, please call [place], [phone].

## 2023-06-15 NOTE — Progress Notes (Unsigned)
Enrolled for [employer] to mail a ZIO XT long term holter monitor to the patients address on file.  

## 2023-06-16 LAB — LIPID PANEL
Chol/HDL Ratio: 3.5 {ratio} (ref 0.0–4.4)
Cholesterol, Total: 264 mg/dL — ABNORMAL HIGH (ref 100–199)
HDL: 75 mg/dL (ref >39)
LDL Chol Calc (NIH): 167 mg/dL — ABNORMAL HIGH (ref 0–99)
Triglycerides: 125 mg/dL (ref 0–149)
VLDL Cholesterol Cal: 22 mg/dL (ref 5–40)

## 2023-06-16 LAB — BASIC METABOLIC PANEL
BUN/Creatinine Ratio: 15 (ref 9–23)
BUN: 15 mg/dL (ref 6–24)
CO2: 23 mmol/L (ref 20–29)
Calcium: 9.5 mg/dL (ref 8.7–10.2)
Chloride: 108 mmol/L — ABNORMAL HIGH (ref 96–106)
Creatinine, Ser: 0.97 mg/dL (ref 0.57–1.00)
Glucose: 88 mg/dL (ref 70–99)
Potassium: 4.8 mmol/L (ref 3.5–5.2)
Sodium: 143 mmol/L (ref 134–144)
eGFR: 68 mL/min/{1.73_m2} (ref >59)

## 2023-06-18 DIAGNOSIS — R002 Palpitations: Secondary | ICD-10-CM | POA: Diagnosis not present

## 2023-07-02 ENCOUNTER — Ambulatory Visit (HOSPITAL_COMMUNITY): Payer: BC Managed Care – PPO

## 2023-07-08 ENCOUNTER — Encounter (HOSPITAL_COMMUNITY): Payer: Self-pay

## 2023-07-10 ENCOUNTER — Ambulatory Visit (HOSPITAL_COMMUNITY)
Admission: RE | Admit: 2023-07-10 | Discharge: 2023-07-10 | Disposition: A | Discharge status: Home / Self Care | Payer: BC Managed Care – PPO | Source: Ambulatory Visit | Attending: Cardiology

## 2023-07-10 DIAGNOSIS — R072 Precordial pain: Secondary | ICD-10-CM | POA: Insufficient documentation

## 2023-07-10 DIAGNOSIS — R079 Chest pain, unspecified: Secondary | ICD-10-CM | POA: Insufficient documentation

## 2023-07-10 MED ORDER — NITROGLYCERIN 0.4 MG SL SUBL
0.8000 mg | SUBLINGUAL_TABLET | Freq: Once | SUBLINGUAL | Status: AC
  Administered 2023-07-10: 0.8 mg via SUBLINGUAL

## 2023-07-10 MED ORDER — IOHEXOL 350 MG/ML SOLN
100.0000 mL | Freq: Once | INTRAVENOUS | Status: AC | PRN
  Administered 2023-07-10: 100 mL via INTRAVENOUS

## 2023-07-10 MED ORDER — NITROGLYCERIN 0.4 MG SL SUBL
SUBLINGUAL_TABLET | SUBLINGUAL | Status: AC
  Filled 2023-07-10: qty 2

## 2023-07-28 NOTE — Progress Notes (Unsigned)
57 y.o. G21P0000 Married Caucasian female here for annual exam.    Patient is followed for HRT.  Doing well.  Had had headaches since her Covid vaccine.  Increase HA with exercise.  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  Husband is also in academics.  Adopted a son from [place].   Going an a cruise.   PCP: Kristian Covey, MD   Patient's last menstrual period was 12/31/2017.           Sexually active: Yes.    The current method of family planning is post menopausal status.    Menopausal hormone therapy:  Vivelle-Dot patch Exercising: Yes.     Gym workout once a week, pilates 1-2 times a week Smoker:  no  OB History  Gravida Para Term Preterm AB Living  0 0 0 0 0 0  SAB IAB Ectopic Multiple Live Births  0 0 0 0 0     HEALTH MAINTENANCE: Last 2 paps:  04/01/22 neg, 02/02/19 neg History of abnormal Pap or positive HPV:  in 20's, ASCUS Mammogram:   10/06/22 BI-RADS CAT C, BI-RADS CAT 1 neg Colonoscopy:  cologuard 05/13/23 neg Bone Density:  n/a  Result  n/a   Immunization History  Administered Date(s) Administered   Influenza Split 08/28/2011, 09/08/2014   Influenza,inj,Quad PF,6+ Mos 05/29/2015, 06/19/2016, 07/17/2020      reports that she has never smoked. She has never been exposed to tobacco smoke. She has never used smokeless tobacco. She reports current alcohol use of about 2.0 standard drinks of alcohol per week. She reports that she does not use drugs.  Past Medical History:  Diagnosis Date   Allergy    Asthma    Cardiac arrhythmia due to congenital heart disease    Chronic back pain    Uterine fibroid     Past Surgical History:  Procedure Laterality Date   GANGLION CYST EXCISION  1980   HYSTEROSCOPY  1999   uterine fibroids    Current Outpatient Medications  Medication Sig Dispense Refill   albuterol (VENTOLIN HFA) 108 (90 Base) MCG/ACT inhaler Inhale 2 puffs into the lungs every 4 (four) hours as needed for  wheezing or shortness of breath (coughing fits). 18 g 1   budesonide (PULMICORT FLEXHALER) 180 MCG/ACT inhaler Inhale 1 puff into the lungs daily. Rinse mouth after each use. 1 each 5   CALCIUM PO Take 1 tablet by mouth daily.     citalopram (CELEXA) 20 MG tablet TAKE 1 TABLET(20 MG) BY MOUTH DAILY 90 tablet 3   doxycycline (VIBRAMYCIN) 100 MG capsule Take 1 capsule (100 mg total) by mouth 2 (two) times daily. 60 capsule 0   Dust Mite Mixed Allergen Ext (ODACTRA) 12 SQ-HDM SUBL Place 1 tablet under the tongue daily. First dose must be taken in the allergy office. (Patient taking differently: Place 1 tablet under the tongue as needed. First dose must be taken in the allergy office.) 30 tablet 5   estradiol (VIVELLE-DOT) 0.05 MG/24HR patch Place 1 patch (0.05 mg total) onto the skin 2 (two) times a week. 24 patch 4   fluticasone (FLONASE SENSIMIST) 27.5 MCG/SPRAY nasal spray Place 1 spray into the nose daily.     Green Tea, Camellia sinensis, (GREEN TEA PO) Take by mouth daily.     loratadine (CLARITIN) 10 MG tablet Take 10 mg by mouth at bedtime.     montelukast (SINGULAIR) 10 MG tablet TAKE 1 TABLET(10  MG) BY MOUTH AT BEDTIME 90 tablet 3   progesterone (PROMETRIUM) 100 MG capsule Take 1 capsule (100 mg total) by mouth at bedtime. 90 capsule 0   VITAMIN D PO Take by mouth daily.     metoprolol tartrate (LOPRESSOR) 50 MG tablet Take 1 tablet (50 mg total) by mouth once for 1 dose. TAKE 2 HOURS PRIOR TO CARDIAC TEST 1 tablet 0   No current facility-administered medications for this visit.    ALLERGIES: Pollen extract, Dust mite extract, Latex, and Tree extract  Family History  Problem Relation Age of Onset   Arthritis Other    Cancer Other        colon   Heart disease Other    Mental illness Other    Cancer Paternal Grandfather        colon   Atrial fibrillation Mother     Review of Systems  All other systems reviewed and are negative.   PHYSICAL EXAM:  BP 124/76 (BP Location: Left  Arm, Patient Position: Sitting, Cuff Size: Normal)   Pulse 79   Ht 5' 1.5" (1.562 m)   Wt 152 lb (68.9 kg)   LMP 12/31/2017 Comment: sexually active  SpO2 98%   BMI 28.26 kg/m     General appearance: alert, cooperative and appears stated age Head: normocephalic, without obvious abnormality, atraumatic Neck: no adenopathy, supple, symmetrical, trachea midline and thyroid normal to inspection and palpation Lungs: clear to auscultation bilaterally Breasts: normal appearance, no masses or tenderness, No nipple retraction or dimpling, No nipple discharge or bleeding, No axillary adenopathy Heart: regular rate and rhythm Abdomen: soft, non-tender; no masses, no organomegaly Extremities: extremities normal, atraumatic, no cyanosis or edema Skin: skin color, texture, turgor normal. No rashes or lesions Lymph nodes: cervical, supraclavicular, and axillary nodes normal. Neurologic: grossly normal  Pelvic: External genitalia:  no lesions              No abnormal inguinal nodes palpated.              Urethra:  normal appearing urethra with no masses, tenderness or lesions              Bartholins and Skenes: normal                 Vagina: normal appearing vagina with normal color and discharge, no lesions              Cervix: no lesions              Pap taken: No. Bimanual Exam:  Uterus:  normal size, contour, position, consistency, mobility, non-tender              Adnexa: no mass, fullness, tenderness              Rectal exam: Yes.  .  Confirms.              Anus:  normal sphincter tone, no lesions  Chaperone was present for exam:  Warren Lacy, CMA  ASSESSMENT: Well woman visit with gynecologic exam HRT.   PLAN: Mammogram screening discussed. Self breast awareness reviewed. Pap and HRV collected:  No. Guidelines for Calcium, Vitamin D, regular exercise program including cardiovascular and weight bearing exercise. Discused WHI and use of HRT which can increase risk of PE, DVT, MI, stroke  and breast cancer.  Medication refills:  Vivelle Dot 0.05 mg twice weekly and Prometrium 100 mg q hs for one year.  Labs with PCP. Follow up:  1 year and prn.

## 2023-08-11 ENCOUNTER — Ambulatory Visit (INDEPENDENT_AMBULATORY_CARE_PROVIDER_SITE_OTHER): Payer: BC Managed Care – PPO | Admitting: Obstetrics and Gynecology

## 2023-08-11 ENCOUNTER — Encounter: Payer: Self-pay | Admitting: Obstetrics and Gynecology

## 2023-08-11 VITALS — BP 124/76 | HR 79 | Ht 61.5 in | Wt 152.0 lb

## 2023-08-11 DIAGNOSIS — Z01419 Encounter for gynecological examination (general) (routine) without abnormal findings: Secondary | ICD-10-CM | POA: Diagnosis not present

## 2023-08-11 DIAGNOSIS — Z79890 Hormone replacement therapy (postmenopausal): Secondary | ICD-10-CM

## 2023-08-11 MED ORDER — ESTRADIOL 0.05 MG/24HR TD PTTW: 1.0000 | MEDICATED_PATCH | TRANSDERMAL | 3 refills | Status: DC

## 2023-08-11 MED ORDER — PROGESTERONE MICRONIZED 100 MG PO CAPS: 100.0000 mg | ORAL_CAPSULE | Freq: Every day | ORAL | 3 refills | Status: DC

## 2023-08-11 NOTE — Patient Instructions (Addendum)
EXERCISE AND DIET:  We recommended that you start or continue a regular exercise program for good health. Regular exercise means any activity that makes your heart beat faster and makes you sweat.  We recommend exercising at least 30 minutes per day at least 3 days a week, preferably 4 or 5.  We also recommend a diet low in fat and sugar.  Inactivity, poor dietary choices and obesity can cause diabetes, heart attack, stroke, and kidney damage, among others.    ALCOHOL AND SMOKING:  Women should limit their alcohol intake to no more than 7 drinks/beers/glasses of wine (combined, not each!) per week. Moderation of alcohol intake to this level decreases your risk of breast cancer and liver damage. And of course, no recreational drugs are part of a healthy lifestyle.  And absolutely no smoking or even second hand smoke. Most people know smoking can cause heart and lung diseases, but did you know it also contributes to weakening of your bones? Aging of your skin?  Yellowing of your teeth and nails?  CALCIUM AND VITAMIN D:  Adequate intake of calcium and Vitamin D are recommended.  The recommendations for exact amounts of these supplements seem to change often, but generally speaking 600 mg of calcium (either carbonate or citrate) and 800 units of Vitamin D per day seems prudent. Certain women may benefit from higher intake of Vitamin D.  If you are among these women, your doctor will have told you during your visit.    PAP SMEARS:  Pap smears, to check for cervical cancer or precancers,  have traditionally been done yearly, although recent scientific advances have shown that most women can have pap smears less often.  However, every woman still should have a physical exam from her gynecologist every year. It will include a breast check, inspection of the vulva and vagina to check for abnormal growths or skin changes, a visual exam of the cervix, and then an exam to evaluate the size and shape of the uterus and  ovaries.  And after 57 years of age, a rectal exam is indicated to check for rectal cancers. We will also provide age appropriate advice regarding health maintenance, like when you should have certain vaccines, screening for sexually transmitted diseases, bone density testing, colonoscopy, mammograms, etc.   MAMMOGRAMS:  All women over 35 years old should have a yearly mammogram. Many facilities now offer a "3D" mammogram, which may cost around $50 extra out of pocket. If possible,  we recommend you accept the option to have the 3D mammogram performed.  It both reduces the number of women who will be called back for extra views which then turn out to be normal, and it is better than the routine mammogram at detecting truly abnormal areas.    COLONOSCOPY:  Colonoscopy to screen for colon cancer is recommended for all women at age 88.  We know, you hate the idea of the prep.  We agree, BUT, having colon cancer and not knowing it is worse!!  Colon cancer so often starts as a polyp that can be seen and removed at colonscopy, which can quite literally save your life!  And if your first colonoscopy is normal and you have no family history of colon cancer, most women don't have to have it again for 10 years.  Once every ten years, you can do something that may end up saving your life, right?  We will be happy to help you get it scheduled when you are ready.  Be sure to check your insurance coverage so you understand how much it will cost.  It may be covered as a preventative service at no cost, but you should check your particular policy.     Hiatal Hernia  A hiatal hernia occurs when part of the stomach slides above the muscle that separates the abdomen from the chest (diaphragm). A person can be born with a hiatal hernia (congenital), or it may develop over time. In almost all cases of hiatal hernia, only the top part of the stomach pushes through the diaphragm. Many people have a hiatal hernia with no symptoms.  The larger the hernia, the more likely it is that you will have symptoms. In some cases, a hiatal hernia allows stomach acid to flow back into the tube that carries food from your mouth to your stomach (esophagus). This may cause heartburn symptoms. The development of heartburn symptoms may mean that you have a condition called gastroesophageal reflux disease (GERD). What are the causes? This condition is caused by a weakness in the opening (hiatus) where the esophagus passes through the diaphragm to attach to the upper part of the stomach. A person may be born with a weakness in the hiatus, or a weakness can develop over time. What increases the risk? This condition is more likely to develop in: Older people. Age is a major risk factor for a hiatal hernia, especially if you are over the age of 22. Pregnant women. People who are overweight. People who have frequent constipation. What are the signs or symptoms? Symptoms of this condition usually develop in the form of GERD symptoms. Symptoms include: Heartburn. Upset stomach (indigestion). Trouble swallowing. Coughing or wheezing. Wheezing is making high-pitched whistling sounds when you breathe. Sore throat. Chest pain. Nausea and vomiting. How is this diagnosed? This condition may be diagnosed during testing for GERD. Tests that may be done include: X-rays of your stomach or chest. An upper gastrointestinal (GI) series. This is an X-ray exam of your GI tract that is taken after you swallow a chalky liquid that shows up clearly on the X-ray. Endoscopy. This is a procedure to look into your stomach using a thin, flexible tube that has a tiny camera and light on the end of it. How is this treated? This condition may be treated by: Dietary and lifestyle changes to help reduce GERD symptoms. Medicines. These may include: Over-the-counter antacids. Medicines that make your stomach empty more quickly. Medicines that block the production of  stomach acid (H2 blockers). Stronger medicines to reduce stomach acid (proton pump inhibitors). Surgery to repair the hernia, if other treatments are not helping. If you have no symptoms, you may not need treatment. Follow these instructions at home: Lifestyle and activity Do not use any products that contain nicotine or tobacco. These products include cigarettes, chewing tobacco, and vaping devices, such as e-cigarettes. If you need help quitting, ask your health care provider. Try to achieve and maintain a healthy body weight. Avoid putting pressure on your abdomen. Anything that puts pressure on your abdomen increases the amount of acid that may be pushed up into your esophagus. Avoid bending over, especially after eating. Raise the head of your bed by putting blocks under the legs. This keeps your head and esophagus higher than your stomach. Do not wear tight clothing around your chest or stomach. Try not to strain when having a bowel movement, when urinating, or when lifting heavy objects. Eating and drinking Avoid foods that can worsen GERD symptoms.  These may include: Fatty foods, like fried foods. Citrus fruits, like oranges or lemon. Other foods and drinks that contain acid, like orange juice or tomatoes. Spicy food. Chocolate. Eat frequent small meals instead of three large meals a day. This helps prevent your stomach from getting too full. Eat slowly. Do not lie down right after eating. Do not eat 1-2 hours before bed. Do not drink beverages with caffeine. These include cola, coffee, cocoa, and tea. Do not drink alcohol. General instructions Take over-the-counter and prescription medicines only as told by your health care provider. Keep all follow-up visits. Your health care provider will want to check that any new prescribed medicines are helping your symptoms. Contact a health care provider if: Your symptoms are not controlled with medicines or lifestyle changes. You are  having trouble swallowing. You have coughing or wheezing that will not go away. Your pain is getting worse. Your pain spreads to your arms, neck, jaw, teeth, or back. You feel nauseous or you vomit. Get help right away if: You have shortness of breath. You vomit blood. You have bright red blood in your stools. You have black, tarry stools. These symptoms may be an emergency. Get help right away. Call 911. Do not wait to see if the symptoms will go away. Do not drive yourself to the hospital. Summary A hiatal hernia occurs when part of the stomach slides above the muscle that separates the abdomen from the chest. A person may be born with a weakness in the hiatus, or a weakness can develop over time. Symptoms of a hiatal hernia may include heartburn, trouble swallowing, or sore throat. Management of a hiatal hernia includes eating frequent small meals instead of three large meals a day. Get help right away if you vomit blood, have bright red blood in your stools, or have black, tarry stools. This information is not intended to replace advice given to you by your health care provider. Make sure you discuss any questions you have with your health care provider. Document Revised: 10/22/2021 Document Reviewed: 10/22/2021 Elsevier Patient Education  2024 [employer].

## 2023-09-03 ENCOUNTER — Ambulatory Visit: Payer: BC Managed Care – PPO | Admitting: Obstetrics and Gynecology

## 2023-10-14 ENCOUNTER — Encounter: Payer: Self-pay | Admitting: Cardiology

## 2023-10-14 ENCOUNTER — Ambulatory Visit: Payer: 59 | Attending: Cardiology | Admitting: Cardiology

## 2023-10-14 VITALS — BP 120/84 | HR 75 | Ht 61.0 in | Wt 152.6 lb

## 2023-10-14 DIAGNOSIS — R079 Chest pain, unspecified: Secondary | ICD-10-CM

## 2023-10-14 DIAGNOSIS — I351 Nonrheumatic aortic (valve) insufficiency: Secondary | ICD-10-CM | POA: Diagnosis not present

## 2023-10-14 DIAGNOSIS — E785 Hyperlipidemia, unspecified: Secondary | ICD-10-CM

## 2023-10-14 DIAGNOSIS — R002 Palpitations: Secondary | ICD-10-CM | POA: Diagnosis not present

## 2023-10-14 NOTE — Patient Instructions (Signed)
 Medication Instructions:  Continue current medications *If you need a refill on your cardiac medications before your next appointment, please call your pharmacy*   Lab Work: Lipid Panel in 6 months If you have labs (blood work) drawn today and your tests are completely normal, you will receive your results only by: MyChart Message (if you have MyChart) OR A paper copy in the mail If you have any lab test that is abnormal or we need to change your treatment, we will call you to review the results.   Testing/Procedures: Echo please schedule to be done in 6 months  Your physician has requested that you have an echocardiogram. Echocardiography is a painless test that uses sound waves to create images of your heart. It provides your doctor with information about the size and shape of your heart and how well your heart's chambers and valves are working. This procedure takes approximately one hour. There are no restrictions for this procedure. Please do NOT wear cologne, perfume, aftershave, or lotions (deodorant is allowed). Please arrive 15 minutes prior to your appointment time.  Please note: We ask at that you not bring children with you during ultrasound (echo/ vascular) testing. Due to room size and safety concerns, children are not allowed in the ultrasound rooms during exams. Our front office staff cannot provide observation of children in our lobby area while testing is being conducted. An adult accompanying a patient to their appointment will only be allowed in the ultrasound room at the discretion of the ultrasound technician under special circumstances. We apologize for any inconvenience.    Follow-Up: At [facility], you and your health needs are our priority.  As part of our continuing mission to provide you with exceptional heart care, we have created designated Provider Care Teams.  These Care Teams include your primary Cardiologist (physician) and Advanced Practice  Providers (APPs -  Physician Assistants and Nurse Practitioners) who all work together to provide you with the care you need, when you need it.  We recommend signing up for the patient portal called MyChart.  Sign up information is provided on this After Visit Summary.  MyChart is used to connect with patients for Virtual Visits (Telemedicine).  Patients are able to view lab/test results, encounter notes, upcoming appointments, etc.  Non-urgent messages can be sent to your provider as well.   To learn more about what you can do with MyChart, go to [email or web address].    Your next appointment:   Post ECHO  please schedule visit after ECHO  Provider:   Lonni LITTIE Nanas, MD     Other Instructions None

## 2023-10-14 NOTE — Progress Notes (Signed)
 Cardiology Office Note:    Date:  10/14/2023   ID:  KOREY ARROYO, DOB August 11, 1966, MRN [id number]  PCP:  Micheal Wolm ORN, MD  Cardiologist:  Lonni LITTIE Nanas, MD  Electrophysiologist:  None   Referring MD: Micheal Wolm ORN, MD   Chief Complaint  Patient presents with   Cardiac Valve Problem    History of Present Illness:    Shelly Combs is a 58 y.o. female with a hx of asthma who presents for follow-up.  She was referred by Dr. Micheal for evaluation of aortic regurgitation, initially seen 06/2023.  Echocardiogram 05/01/2023 showed EF 60 to 65%, normal RV function, moderate aortic regurgitation, mild mitral regurgitation.  At initial clinic visit, reported having chest pain and palpitations.  Coronary CTA on 07/10/2023 showed normal coronary arteries, calcium score 0.  Zio patch x 6 days 06/2023 showed no significant arrhythmias.  Since last clinic visit, she reports she is doing.  Denies any further chest pain or dyspnea.  Reports occasional palpitations about once per week that just last for few seconds.  She started working out once per week with systems analyst and is made dietary changes.  Past Medical History:  Diagnosis Date   Allergy     Asthma    Cardiac arrhythmia due to congenital heart disease    Chronic back pain    Uterine fibroid     Past Surgical History:  Procedure Laterality Date   GANGLION CYST EXCISION  1980   HYSTEROSCOPY  1999   uterine fibroids    Current Medications: Current Meds  Medication Sig   albuterol  (VENTOLIN  HFA) 108 (90 Base) MCG/ACT inhaler Inhale 2 puffs into the lungs every 4 (four) hours as needed for wheezing or shortness of breath (coughing fits).   budesonide  (PULMICORT  FLEXHALER) 180 MCG/ACT inhaler Inhale 1 puff into the lungs daily. Rinse mouth after each use.   CALCIUM PO Take 1 tablet by mouth daily.   citalopram  (CELEXA ) 20 MG tablet TAKE 1 TABLET(20 MG) BY MOUTH DAILY   doxycycline  (VIBRAMYCIN ) 100 MG capsule Take  1 capsule (100 mg total) by mouth 2 (two) times daily.   Dust Mite Mixed Allergen Ext (ODACTRA) 12 SQ-HDM SUBL Place 1 tablet under the tongue daily. First dose must be taken in the allergy  office. (Patient taking differently: Place 1 tablet under the tongue as needed. First dose must be taken in the allergy  office.)   estradiol  (VIVELLE -DOT) 0.05 MG/24HR patch Place 1 patch (0.05 mg total) onto the skin 2 (two) times a week.   fluticasone  (FLONASE SENSIMIST ) 27.5 MCG/SPRAY nasal spray Place 1 spray into the nose daily.   Green Tea, Camellia sinensis, (GREEN TEA PO) Take by mouth daily.   loratadine (CLARITIN) 10 MG tablet Take 10 mg by mouth at bedtime.   montelukast  (SINGULAIR ) 10 MG tablet TAKE 1 TABLET(10 MG) BY MOUTH AT BEDTIME   progesterone  (PROMETRIUM ) 100 MG capsule Take 1 capsule (100 mg total) by mouth at bedtime.   VITAMIN D  PO Take by mouth daily.     Allergies:   Pollen extract, Dust mite extract, Latex, and Tree extract   Social History   Socioeconomic History   Marital status: Married    Spouse name: Not on file   Number of children: Not on file   Years of education: Not on file   Highest education level: Doctorate  Occupational History   Not on file  Tobacco Use   Smoking status: Never    Passive exposure: Never  Smokeless tobacco: Never  Vaping Use   Vaping status: Never Used  Substance and Sexual Activity   Alcohol use: Yes    Alcohol/week: 2.0 standard drinks of alcohol    Types: 2 Glasses of wine per week    Comment: 2 glasses of wine a night    Drug use: No   Sexual activity: Yes    Partners: Male    Birth control/protection: Post-menopausal    Comment: 1st intercourse- 19, partners- more than 5  Other Topics Concern   Not on file  Social History Narrative   Not on file   Social Drivers of Health   Financial Resource Strain: Low Risk  (10/20/2022)   Overall Financial Resource Strain (CARDIA)    Difficulty of Paying Living Expenses: Not hard at all   Food Insecurity: No Food Insecurity (10/20/2022)   Hunger Vital Sign    Worried About Running Out of Food in the Last Year: Never true    Ran Out of Food in the Last Year: Never true  Transportation Needs: No Transportation Needs (10/20/2022)   PRAPARE - Administrator, Civil Service (Medical): No    Lack of Transportation (Non-Medical): No  Physical Activity: Insufficiently Active (10/20/2022)   Exercise Vital Sign    Days of Exercise per Week: 7 days    Minutes of Exercise per Session: 20 min  Stress: Stress Concern Present (10/20/2022)   [employer] of Occupational Health - Occupational Stress Questionnaire    Feeling of Stress : To some extent  Social Connections: Socially Integrated (10/20/2022)   Social Connection and Isolation Panel [NHANES]    Frequency of Communication with Friends and Family: Three times a week    Frequency of Social Gatherings with Friends and Family: Once a week    Attends Religious Services: 1 to 4 times per year    Active Member of [employer] or Organizations: Yes    Attends Engineer, Structural: More than 4 times per year    Marital Status: Married     Family History: The patient's family history includes Arthritis in an other family member; Atrial fibrillation in her mother; Cancer in her paternal grandfather and another family member; Heart disease in an other family member; Mental illness in an other family member.  ROS:   Please see the history of present illness.     All other systems reviewed and are negative.  EKGs/Labs/Other Studies Reviewed:    The following studies were reviewed today:   EKG:   06/15/23: Normal sinus rhythm, Q waves V1/2, rate 74  Recent Labs: 06/15/2023: BUN 15; Creatinine, Ser 0.97; Potassium 4.8; Sodium 143  Recent Lipid Panel    Component Value Date/Time   CHOL 264 (H) 06/15/2023 1125   TRIG 125 06/15/2023 1125   HDL 75 06/15/2023 1125   CHOLHDL 3.5 06/15/2023 1125   CHOLHDL 3 04/03/2021  0957   VLDL 15.2 04/03/2021 0957   LDLCALC 167 (H) 06/15/2023 1125    Physical Exam:    VS:  BP 120/84 (BP Location: Left Arm, Patient Position: Sitting)   Pulse 75   Ht 5' 1 (1.549 m)   Wt 152 lb 9.6 oz (69.2 kg)   LMP 12/31/2017 Comment: sexually active  SpO2 100%   BMI 28.83 kg/m     Wt Readings from Last 3 Encounters:  10/14/23 152 lb 9.6 oz (69.2 kg)  08/11/23 152 lb (68.9 kg)  06/15/23 152 lb 9.6 oz (69.2 kg)     GEN:  Well nourished, well developed in no acute distress HEENT: Normal NECK: No JVD; No carotid bruits LYMPHATICS: No lymphadenopathy CARDIAC: RRR, no murmurs, rubs, gallops RESPIRATORY:  Clear to auscultation without rales, wheezing or rhonchi  ABDOMEN: Soft, non-tender, non-distended MUSCULOSKELETAL:  No edema; No deformity  SKIN: Warm and dry NEUROLOGIC:  Alert and oriented x 3 PSYCHIATRIC:  Normal affect   ASSESSMENT:    1. Aortic valve insufficiency, etiology of cardiac valve disease unspecified   2. Hyperlipidemia, unspecified hyperlipidemia type   3. Chest pain of uncertain etiology   4. Palpitations     PLAN:    Chest pain: Atypical in description.  Coronary CTA on 07/10/2023 showed normal coronary arteries, calcium score 0.  Palpitations:   Zio patch x 6 days 06/2023 showed no significant arrhythmias.  Aortic regurgitation: Moderate AI on echo 04/2023.  Repeat echocardiogram in 1 year to monitor (04/2024)  Hyperlipidemia: LDL 167 on 06/15/2023.  10-year ASCVD risk score 2%.  Normal coronary arteries on coronary CTA as above.  Diet/exercise recommended.  RTC in 6 months  Medication Adjustments/Labs and Tests Ordered: Current medicines are reviewed at length with the patient today.  Concerns regarding medicines are outlined above.  Orders Placed This Encounter  Procedures   Lipid panel   ECHOCARDIOGRAM COMPLETE   No orders of the defined types were placed in this encounter.   Patient Instructions  Medication Instructions:  Continue  current medications *If you need a refill on your cardiac medications before your next appointment, please call your pharmacy*   Lab Work: Lipid Panel in 6 months If you have labs (blood work) drawn today and your tests are completely normal, you will receive your results only by: MyChart Message (if you have MyChart) OR A paper copy in the mail If you have any lab test that is abnormal or we need to change your treatment, we will call you to review the results.   Testing/Procedures: Echo please schedule to be done in 6 months  Your physician has requested that you have an echocardiogram. Echocardiography is a painless test that uses sound waves to create images of your heart. It provides your doctor with information about the size and shape of your heart and how well your heart's chambers and valves are working. This procedure takes approximately one hour. There are no restrictions for this procedure. Please do NOT wear cologne, perfume, aftershave, or lotions (deodorant is allowed). Please arrive 15 minutes prior to your appointment time.  Please note: We ask at that you not bring children with you during ultrasound (echo/ vascular) testing. Due to room size and safety concerns, children are not allowed in the ultrasound rooms during exams. Our front office staff cannot provide observation of children in our lobby area while testing is being conducted. An adult accompanying a patient to their appointment will only be allowed in the ultrasound room at the discretion of the ultrasound technician under special circumstances. We apologize for any inconvenience.    Follow-Up: At [facility], you and your health needs are our priority.  As part of our continuing mission to provide you with exceptional heart care, we have created designated Provider Care Teams.  These Care Teams include your primary Cardiologist (physician) and Advanced Practice Providers (APPs -  Physician Assistants and  Nurse Practitioners) who all work together to provide you with the care you need, when you need it.  We recommend signing up for the patient portal called MyChart.  Sign up information is provided  on this After Visit Summary.  MyChart is used to connect with patients for Virtual Visits (Telemedicine).  Patients are able to view lab/test results, encounter notes, upcoming appointments, etc.  Non-urgent messages can be sent to your provider as well.   To learn more about what you can do with MyChart, go to [email or web address].    Your next appointment:   Post ECHO  please schedule visit after ECHO  Provider:   Lonni LITTIE Nanas, MD     Other Instructions None         Signed, Lonni LITTIE Nanas, MD  10/14/2023 1:17 PM    [facility]

## 2023-11-03 ENCOUNTER — Ambulatory Visit: Payer: 59 | Admitting: Family Medicine

## 2023-11-03 ENCOUNTER — Encounter: Payer: Self-pay | Admitting: Family Medicine

## 2023-11-03 VITALS — BP 122/78 | HR 78 | Temp 97.9°F | Ht 61.0 in | Wt 154.4 lb

## 2023-11-03 DIAGNOSIS — H9313 Tinnitus, bilateral: Secondary | ICD-10-CM | POA: Diagnosis not present

## 2023-11-03 DIAGNOSIS — H9190 Unspecified hearing loss, unspecified ear: Secondary | ICD-10-CM

## 2023-11-03 NOTE — Progress Notes (Signed)
 Established Patient Office Visit  Subjective   Patient ID: Shelly Combs, female    DOB: 1966-07-26  Age: 58 y.o. MRN: [id number]  Chief Complaint  Patient presents with   Ear Fullness    Pt c/o fullness on Left and ringing on R ear. Sx started beginning of February.  Pt denied fever, sore throat.    Headache    Pt c/o pressure headache on whole left side.    Sinus Problem    Pt c/o pressure of both cheek. Pt reports lost some of taste sense.    Follow-up    Pt want like to discuss on CT was done in 07/2023. Pt reports there was hiatal hernia found in the CT.     HPI   Shelly Combs is seen with complaints of ear fullness somewhat bilaterally but especially left ear.  She also has some intermittent tinnitus type symptoms.  No pulsatile tinnitus. She has had some intermittent headaches.  Denies any purulent nasal secretions.  No cough.  No documented fever.  Subjective decreased hearing left ear but only relatively mild.  She has had the symptoms for about 3 weeks.  She tried over-the-counter Sudafed and Flonase without relief.  Denies any facial pain.  She had small hiatal hernia on recent CT scan but no active GERD symptoms.  Past Medical History:  Diagnosis Date   Allergy    Asthma    Cardiac arrhythmia due to congenital heart disease    Chronic back pain    Uterine fibroid    Past Surgical History:  Procedure Laterality Date   GANGLION CYST EXCISION  1980   HYSTEROSCOPY  1999   uterine fibroids    reports that she has never smoked. She has never been exposed to tobacco smoke. She has never used smokeless tobacco. She reports current alcohol use of about 2.0 standard drinks of alcohol per week. She reports that she does not use drugs. family history includes Arthritis in an other family member; Atrial fibrillation in her mother; Cancer in her paternal grandfather and another family member; Heart disease in an other family member; Mental illness in an other family  member. Allergies  Allergen Reactions   Pollen Extract Cough, Itching and Shortness Of Breath   Dust Mite Extract Itching   Latex     Other reaction(s): itching   Tree Extract Itching    Review of Systems  Constitutional:  Negative for chills and fever.  HENT:  Positive for hearing loss and tinnitus. Negative for ear discharge and sinus pain.   Cardiovascular:  Negative for chest pain.  Neurological:  Positive for headaches. Negative for dizziness.      Objective:     BP 122/78 (BP Location: Left Arm, Patient Position: Sitting, Cuff Size: Normal)   Pulse 78   Temp 97.9 F (36.6 C) (Oral)   Ht 5\' 1"  (1.549 m)   Wt 154 lb 6.4 oz (70 kg)   LMP 12/31/2017 Comment: sexually active  SpO2 98%   BMI 29.17 kg/m  BP Readings from Last 3 Encounters:  11/03/23 122/78  10/14/23 120/84  08/11/23 124/76   Wt Readings from Last 3 Encounters:  11/03/23 154 lb 6.4 oz (70 kg)  10/14/23 152 lb 9.6 oz (69.2 kg)  08/11/23 152 lb (68.9 kg)      Physical Exam Vitals reviewed.  Constitutional:      General: She is not in acute distress.    Appearance: She is not ill-appearing.  HENT:  Right Ear: Tympanic membrane and ear canal normal. No middle ear effusion.     Left Ear: Tympanic membrane and ear canal normal.  No middle ear effusion.  Cardiovascular:     Rate and Rhythm: Normal rate and regular rhythm.  Pulmonary:     Effort: Pulmonary effort is normal.     Breath sounds: Normal breath sounds. No wheezing or rales.  Neurological:     Mental Status: She is alert.      No results found for any visits on 11/03/23.    The 10-year ASCVD risk score (Arnett DK, et al., 2019) is: 2.5%    Assessment & Plan:   Problem List Items Addressed This Visit   None Visit Diagnoses       Tinnitus of both ears    -  Primary     Subjective hearing loss       Relevant Orders   Ambulatory referral to Audiology     Patient is seen with about 3-week history of subjective mild  hearing loss left ear and vague tinnitus type symptoms bilaterally.  No pulsatile tinnitus.  Symptoms do not sound consistent with sudden sensorineural hearing loss.  She denies any vertigo.  No evidence for cerumen or effusion on exam.  Set up audiology assessment  No follow-ups on file.    Evelena Peat, MD

## 2023-11-18 LAB — HM MAMMOGRAPHY

## 2023-12-16 ENCOUNTER — Ambulatory Visit: Payer: 59 | Attending: Family Medicine | Admitting: Audiologist

## 2023-12-16 DIAGNOSIS — H9311 Tinnitus, right ear: Secondary | ICD-10-CM | POA: Insufficient documentation

## 2023-12-16 DIAGNOSIS — H938X2 Other specified disorders of left ear: Secondary | ICD-10-CM | POA: Insufficient documentation

## 2023-12-16 DIAGNOSIS — H93293 Other abnormal auditory perceptions, bilateral: Secondary | ICD-10-CM | POA: Insufficient documentation

## 2023-12-16 NOTE — Procedures (Signed)
  Outpatient Audiology and [facility] [address] [place], [place]  [address] [phone]  AUDIOLOGICAL  EVALUATION  NAME: Shelly Combs     DOB:   1966/07/02      MRN: [id number]                                                                                     DATE: 12/16/2023     REFERENT: Kristian Covey, MD STATUS: Outpatient DIAGNOSIS: Tinnitus, Decreased Hearing    History: Shelly Combs was seen for an audiological evaluation due to tinnitus in the right ear and fullness in the left ear. She has some eustachian tube dysfunction in the left ear. She is a Technical sales engineer with perfect pitch. She is very aware of how she hears and what she hears. The sound of her husband chewing makes her uncomfortable. She feels recently she had some mild COVID.   Medical history shows no additional risk for hearing loss. Does show history of anxiety. Jaidynn and her husband have been under additional stress since COVID.   Evaluation:  Otoscopy showed a clear view of the tympanic membranes, bilaterally Tympanometry results were consistent with normal middle ear function, bilaterally   Audiometric testing was completed using Conventional Audiometry techniques with insert earphones and supraural headphones. Test results are consistent with normal hearing sloping to mild loss at [facility] only. Speech Recognition Thresholds were obtained at 15dB HL in the right ear and at 10dB HL in the left ear. Word Recognition Testing was completed at  40dB SL and Shelly Combs scored 100% in each ear.    Results:  The test results were reviewed with Shelly Combs. She has a slight hearing loss in both ears that is a little worse in the right ear. The tinnitus and misphonia are due to stress and anxiety. The brain will fixate on sounds if you are holding a lot of stress, especially for someone musically trained. She needs to use sound to mask the tinnitus and calm her nervous system. Audiogram printed and provided to Shelly Combs.     Recommendations: Mask tinnitus with bass steady state sounds like running water or brown noise.  Seek help for mental health and talk with husband about chewing sensitivity. Turn music on while eating.     32 minutes spent testing and counseling on results.   If you have any questions please feel free to contact me at [phone].  Ammie Ferrier Stalnaker Au.D.  Audiologist   12/16/2023  10:15 AM  Cc: Kristian Covey, MD

## 2023-12-18 ENCOUNTER — Ambulatory Visit: Admitting: Family Medicine

## 2023-12-18 ENCOUNTER — Encounter: Payer: Self-pay | Admitting: Family Medicine

## 2023-12-18 VITALS — BP 130/80 | HR 74 | Temp 97.8°F | Wt 153.4 lb

## 2023-12-18 DIAGNOSIS — M2550 Pain in unspecified joint: Secondary | ICD-10-CM | POA: Diagnosis not present

## 2023-12-18 DIAGNOSIS — R29898 Other symptoms and signs involving the musculoskeletal system: Secondary | ICD-10-CM | POA: Diagnosis not present

## 2023-12-18 LAB — C-REACTIVE PROTEIN: CRP: <1 mg/dL (ref 0.5–20.0)

## 2023-12-18 LAB — SEDIMENTATION RATE: Sed Rate: 5 mm/h (ref 0–30)

## 2023-12-18 NOTE — Progress Notes (Signed)
 Established Patient Office Visit  Subjective   Patient ID: Shelly Combs, female    DOB: 1966-01-15  Age: 58 y.o. MRN: [id number]  Chief Complaint  Patient presents with   Ankle weakness    Patient complains of ankle weakness, x3 weeks     HPI   Shelly Combs seen with some polyarthralgias and sensation of her ankles "giving out "recently.  She notices coming down some stairs.  She states most of her life she has had intermittent episodes of weak ankles.  She recalls episodes as a child where she would be running along and would almost fall.  She has noticed couple episodes recently where they seem to give way.  She differentiates this from any weakness in her quadriceps or otherwise lower extremity.  Denies any recent major swelling of the ankles.  She has had some pain involving both ankles and also several other joints including thumbs bilaterally.  She specifically had concerns about possible autoimmune or inflammatory arthritis.  No known family history of rheumatoid arthritis.  Someone in her family was diagnosed with fibromyalgia.  She has not noted any recent warmth or visible edema involving any joints.  No fever.  No unusual skin rashes.  She has had previous tick bites and wonders if this may be somehow related to prior tick bite.  No recent tick bites in the past couple of months.  No unexplained fevers.  Past Medical History:  Diagnosis Date   Allergy    Asthma    Cardiac arrhythmia due to congenital heart disease    Chronic back pain    Uterine fibroid    Past Surgical History:  Procedure Laterality Date   GANGLION CYST EXCISION  1980   HYSTEROSCOPY  1999   uterine fibroids    reports that she has never smoked. She has never been exposed to tobacco smoke. She has never used smokeless tobacco. She reports current alcohol use of about 2.0 standard drinks of alcohol per week. She reports that she does not use drugs. family history includes Arthritis in an other family member;  Atrial fibrillation in her mother; Cancer in her paternal grandfather and another family member; Heart disease in an other family member; Mental illness in an other family member. Allergies  Allergen Reactions   Pollen Extract Cough, Itching and Shortness Of Breath   Dust Mite Extract Itching   Latex     Other reaction(s): itching   Tree Extract Itching    Review of Systems  Constitutional:  Negative for chills and fever.  Respiratory:  Negative for shortness of breath.   Cardiovascular:  Negative for chest pain.  Musculoskeletal:  Positive for joint pain. Negative for back pain and myalgias.  Skin:  Negative for rash.      Objective:     BP 130/80 (BP Location: Left Arm, Patient Position: Sitting, Cuff Size: Normal)   Pulse 74   Temp 97.8 F (36.6 C) (Oral)   Wt 153 lb 6.4 oz (69.6 kg)   LMP 12/31/2017 Comment: sexually active  SpO2 99%   BMI 28.98 kg/m  BP Readings from Last 3 Encounters:  12/18/23 130/80  11/03/23 122/78  10/14/23 120/84   Wt Readings from Last 3 Encounters:  12/18/23 153 lb 6.4 oz (69.6 kg)  11/03/23 154 lb 6.4 oz (70 kg)  10/14/23 152 lb 9.6 oz (69.2 kg)      Physical Exam Vitals reviewed.  Constitutional:      General: She is not in acute distress.  Appearance: She is not ill-appearing.  Cardiovascular:     Rate and Rhythm: Normal rate and regular rhythm.     Heart sounds: Murmur heard.     Comments: She has soft systolic murmur right upper sternal border consistent with her aortic regurgitation which is now followed by cardiology Pulmonary:     Effort: Pulmonary effort is normal.     Breath sounds: Normal breath sounds. No wheezing or rales.  Musculoskeletal:     Comments: Hands, wrist, ankles examined and no visible edema.  No warmth.  No erythema.  She has excellent mobility in both ankles.  No Achilles tenderness.  No localizing tenderness.  No warmth.  Neurological:     Mental Status: She is alert.     Comments: No appreciable  weakness with plantarflexion, dorsiflexion, knee extension, or hip flexion bilaterally.      No results found for any visits on 12/18/23.  Last CBC Lab Results  Component Value Date   WBC 7.4 07/12/2021   HGB 13.7 07/12/2021   HCT 42.0 07/12/2021   MCV 95.0 07/12/2021   MCH 31.0 07/12/2021   RDW 11.6 07/12/2021   PLT 275 07/12/2021   Last metabolic panel Lab Results  Component Value Date   GLUCOSE 88 06/15/2023   NA 143 06/15/2023   K 4.8 06/15/2023   CL 108 (H) 06/15/2023   CO2 23 06/15/2023   BUN 15 06/15/2023   CREATININE 0.97 06/15/2023   EGFR 68 06/15/2023   CALCIUM 9.5 06/15/2023   PROT 6.4 (L) 07/12/2021   ALBUMIN 3.9 07/12/2021   BILITOT 0.5 07/12/2021   ALKPHOS 58 07/12/2021   AST 23 07/12/2021   ALT 24 07/12/2021   ANIONGAP 7 07/12/2021   Last lipids Lab Results  Component Value Date   CHOL 264 (H) 06/15/2023   HDL 75 06/15/2023   LDLCALC 167 (H) 06/15/2023   TRIG 125 06/15/2023   CHOLHDL 3.5 06/15/2023   Last thyroid functions Lab Results  Component Value Date   TSH 2.34 11/01/2019      The 10-year ASCVD risk score (Arnett DK, et al., 2019) is: 2.8%    Assessment & Plan:   Problem List Items Addressed This Visit   None Visit Diagnoses       Polyarthralgia    -  Primary   Relevant Orders   Sedimentation rate   C-reactive Protein   Rheumatoid Factor   Cyclic citrul peptide antibody, IgG   B. [email or web address] Antibody     Patient presents with somewhat of a chronic intermittent issue with concern for bilateral ankle weakness and some polyarthralgias mostly involving the ankles but also hands.  No objective evidence for inflammatory arthritis on exam.  Will check labs as above. We have suggested some regular strengthening exercises for ankles particularly  No follow-ups on file.    Evelena Peat, MD

## 2023-12-20 LAB — CYCLIC CITRUL PEPTIDE ANTIBODY, IGG: Cyclic Citrullin Peptide Ab: <16 U

## 2023-12-20 LAB — B. BURGDORFI ANTIBODIES: B burgdorferi Ab IgG+IgM: <0.9 {index}

## 2023-12-20 LAB — RHEUMATOID FACTOR: Rheumatoid fact SerPl-aCnc: <10 [IU]/mL (ref <14)

## 2023-12-22 ENCOUNTER — Encounter: Payer: Self-pay | Admitting: *Deleted

## 2024-03-02 ENCOUNTER — Telehealth: Payer: Self-pay | Admitting: Allergy

## 2024-03-02 MED ORDER — PULMICORT FLEXHALER 180 MCG/ACT IN AEPB: 1.0000 | INHALATION_SPRAY | Freq: Every day | RESPIRATORY_TRACT | 0 refills | Status: DC

## 2024-03-02 NOTE — Telephone Encounter (Signed)
 LVM informing the patient that a refill on pulmicort  has been sent into the [facility] on [address].

## 2024-03-02 NOTE — Telephone Encounter (Signed)
 Laurna called and scheduled an OV appointment with Dr. Luke. She asked if she could possibly get a refill of Pulmicort  in the mean time as she is almost completely out. [facility] on [address]. Best number to contact [phone].

## 2024-03-18 ENCOUNTER — Ambulatory Visit: Admitting: Allergy

## 2024-04-03 NOTE — Progress Notes (Unsigned)
 Follow Up Note  RE: Shelly Combs MRN: [id number] DOB: 1965/11/28 Date of Office Visit: 04/04/2024  Referring provider: Micheal Wolm ORN, MD Primary care provider: Micheal Wolm ORN, MD  Chief Complaint: No chief complaint on file.  History of Present Illness: I had the pleasure of seeing Shelly Combs for a follow up visit at the [facility] on 04/04/2024. She is a 58 y.o. female, who is being followed for asthma, allergic rhinitis, adverse food reaction. Her previous allergy  office visit was on 10/06/2022 with Dr. Luke. Today is a regular follow up visit.  Discussed the use of AI scribe software for clinical note transcription with the patient, who gave verbal consent to proceed.  History of Present Illness            ***  Assessment and Plan: Shelly Combs is a 58 y.o. female with: Asthma Past history - Daily symptoms with chest tightness, wheezing, coughing.  Currently on Singulair  daily.  Used to be on Qvar  in the past but still had symptoms.  Had COVID-19 twice. 2023 spirometry showed some restriction with 6% improvement FEV1 postbronchodilator treatment.  Clinically feeling improved. Interim history - only used albuterol  during respiratory infection. No prednisone .  Today's spirometry was normal. Daily controller medication(s): start Arnuity 100mcg 1 puff once a day and rinse mouth after each use.  Stop Breo.   Let me know if it's not covered.  Continue Singulair  (montelukast ) 10mg  daily at night. May use albuterol  rescue inhaler 2 puffs every 4 to 6 hours as needed for shortness of breath, chest tightness, coughing, and wheezing. May use albuterol  rescue inhaler 2 puffs 5 to 15 minutes prior to strenuous physical activities. Monitor frequency of use.  Get spirometry at next visit.   Perennial allergic rhinitis Past history - Perennial rhinoconjunctivitis symptoms for 30+ years with worsening in the spring.  Tried Singulair , Flonase, Claritin with some benefit.  No prior AIT. 2023 skin testing showed: Positive to dust mites.  Interim history - nasal congestion but not using daily Flonase.  Continue environmental control measures as below. Use over the counter antihistamines such as Zyrtec (cetirizine), Claritin (loratadine), Allegra (fexofenadine), or Xyzal (levocetirizine) daily as needed. May take twice a day during allergy  flares. May switch antihistamines every few months. Continue Singulair  (montelukast ) 10mg  daily at night. May take Flonase Sensamist nasal spray 1 spray per nostril 1-2 times a day as needed for nasal congestion.  Nasal saline spray (i.e., Simply Saline) or nasal saline lavage (i.e., NeilMed) is recommended as needed and prior to medicated nasal sprays. Read about Odactra sublingual immunotherapy. First dose given in office with 30 minute wait then it's once a day dosing at home.  [email or web address] Let me know if not covered - Rx sent in.    Other adverse food reactions, not elsewhere classified, subsequent encounter Past history - Noted perioral pruritus with soy and fresh apples. Chobani yogurt and leftover fish caused dizziness in the past. Noted increased allergic symptoms after red meat consumption. Alpha gal in the past was negative. 2023 skin testing showed: Negative to soy and apple. 2023 bloodwork negative to alpha gal. Interim history -  no reactions.  Continue to avoid foods that are bothersome - soy, fresh apples, chobani yogurt, old fish. Limit red meat.    Chest pain Noted left sided chest pains due to stress at work. Had similar symptoms 1 year ago after family death and work up was negative at that time. There is family history of  cardiovascular disease. Follow up with PCP regarding this.  Assessment and Plan              No follow-ups on file.  No orders of the defined types were placed in this encounter.  Lab Orders  No laboratory test(s) ordered today    Diagnostics: Spirometry:  Tracings  reviewed. Her effort: {Blank single:19197::Good reproducible efforts.,It was hard to get consistent efforts and there is a question as to whether this reflects a maximal maneuver.,Poor effort, data can not be interpreted.} FVC: ***L FEV1: ***L, ***% predicted FEV1/FVC ratio: ***% Interpretation: {Blank single:19197::Spirometry consistent with mild obstructive disease,Spirometry consistent with moderate obstructive disease,Spirometry consistent with severe obstructive disease,Spirometry consistent with possible restrictive disease,Spirometry consistent with mixed obstructive and restrictive disease,Spirometry uninterpretable due to technique,Spirometry consistent with normal pattern,No overt abnormalities noted given today's efforts}.  Please see scanned spirometry results for details.  Skin Testing: {Blank single:19197::Select foods,Environmental allergy  panel,Environmental allergy  panel and select foods,Food allergy  panel,None,Deferred due to recent antihistamines use}. *** Results discussed with patient/family.   Medication List:  Current Outpatient Medications  Medication Sig Dispense Refill  . albuterol  (VENTOLIN  HFA) 108 (90 Base) MCG/ACT inhaler Inhale 2 puffs into the lungs every 4 (four) hours as needed for wheezing or shortness of breath (coughing fits). 18 g 1  . budesonide  (PULMICORT  FLEXHALER) 180 MCG/ACT inhaler Inhale 1 puff into the lungs daily. Rinse mouth after each use. 1 each 0  . CALCIUM PO Take 1 tablet by mouth daily.    . citalopram  (CELEXA ) 20 MG tablet TAKE 1 TABLET(20 MG) BY MOUTH DAILY 90 tablet 3  . doxycycline  (VIBRAMYCIN ) 100 MG capsule Take 1 capsule (100 mg total) by mouth 2 (two) times daily. 60 capsule 0  . Dust Mite Mixed Allergen Ext (ODACTRA) 12 SQ-HDM SUBL Place 1 tablet under the tongue daily. First dose must be taken in the allergy  office. (Patient taking differently: Place 1 tablet under the tongue as needed. First dose  must be taken in the allergy  office.) 30 tablet 5  . estradiol  (VIVELLE -DOT) 0.05 MG/24HR patch Place 1 patch (0.05 mg total) onto the skin 2 (two) times a week. 24 patch 3  . fluticasone  (FLONASE SENSIMIST ) 27.5 MCG/SPRAY nasal spray Place 1 spray into the nose daily.    . Green Tea, Camellia sinensis, (GREEN TEA PO) Take by mouth daily.    SABRA loratadine (CLARITIN) 10 MG tablet Take 10 mg by mouth at bedtime.    . metoprolol  tartrate (LOPRESSOR ) 50 MG tablet Take 1 tablet (50 mg total) by mouth once for 1 dose. TAKE 2 HOURS PRIOR TO CARDIAC TEST 1 tablet 0  . montelukast  (SINGULAIR ) 10 MG tablet TAKE 1 TABLET(10 MG) BY MOUTH AT BEDTIME 90 tablet 3  . progesterone  (PROMETRIUM ) 100 MG capsule Take 1 capsule (100 mg total) by mouth at bedtime. 90 capsule 3  . VITAMIN D  PO Take by mouth daily.     No current facility-administered medications for this visit.   Allergies: Allergies  Allergen Reactions  . Pollen Extract Cough, Itching and Shortness Of Breath  . Dust Mite Extract Itching  . Latex     Other reaction(s): itching  . Tree Extract Itching   I reviewed her past medical history, social history, family history, and environmental history and no significant changes have been reported from her previous visit.  Review of Systems  Constitutional:  Negative for appetite change, chills, fever and unexpected weight change.  HENT:  Positive for congestion. Negative for rhinorrhea.  Eyes:  Negative for itching.  Respiratory:  Negative for cough, chest tightness, shortness of breath and wheezing.   Cardiovascular:  Positive for chest pain.  Gastrointestinal:  Negative for abdominal pain.  Genitourinary:  Negative for difficulty urinating.  Skin:  Negative for rash.  Allergic/Immunologic: Positive for environmental allergies.  Neurological:  Negative for headaches.    Objective: LMP 12/31/2017 Comment: sexually active There is no height or weight on file to calculate BMI. Physical  Exam Vitals and nursing note reviewed.  Constitutional:      Appearance: Normal appearance. She is well-developed.  HENT:     Head: Normocephalic and atraumatic.     Right Ear: Tympanic membrane and external ear normal.     Left Ear: Tympanic membrane and external ear normal.     Nose: Congestion present.     Mouth/Throat:     Mouth: Mucous membranes are moist.     Pharynx: Oropharynx is clear.  Eyes:     Conjunctiva/sclera: Conjunctivae normal.  Cardiovascular:     Rate and Rhythm: Normal rate and regular rhythm.     Heart sounds: Normal heart sounds. No murmur heard.    No friction rub. No gallop.  Pulmonary:     Effort: Pulmonary effort is normal.     Breath sounds: Normal breath sounds. No wheezing, rhonchi or rales.  Musculoskeletal:     Cervical back: Neck supple.  Skin:    General: Skin is warm.     Findings: No rash.  Neurological:     Mental Status: She is alert and oriented to person, place, and time.  Psychiatric:        Behavior: Behavior normal.   Previous notes and tests were reviewed. The plan was reviewed with the patient/family, and all questions/concerned were addressed.  It was my pleasure to see Shelly Combs today and participate in her care. Please feel free to contact me with any questions or concerns.  Sincerely,  Orlan Cramp, DO Allergy  & Immunology  [facility]  [place] office: [phone] [facility] office: [phone]

## 2024-04-04 ENCOUNTER — Other Ambulatory Visit: Payer: Self-pay

## 2024-04-04 ENCOUNTER — Ambulatory Visit: Admitting: Allergy

## 2024-04-04 ENCOUNTER — Encounter: Payer: Self-pay | Admitting: Allergy

## 2024-04-04 VITALS — BP 118/78 | HR 62 | Temp 96.2°F | Resp 16 | Ht 61.81 in | Wt 155.4 lb

## 2024-04-04 DIAGNOSIS — J4540 Moderate persistent asthma, uncomplicated: Secondary | ICD-10-CM

## 2024-04-04 DIAGNOSIS — J3089 Other allergic rhinitis: Secondary | ICD-10-CM | POA: Diagnosis not present

## 2024-04-04 DIAGNOSIS — T781XXD Other adverse food reactions, not elsewhere classified, subsequent encounter: Secondary | ICD-10-CM | POA: Diagnosis not present

## 2024-04-04 MED ORDER — PULMICORT FLEXHALER 180 MCG/ACT IN AEPB: 1.0000 | INHALATION_SPRAY | Freq: Two times a day (BID) | RESPIRATORY_TRACT | 5 refills | Status: DC

## 2024-04-04 MED ORDER — AIRSUPRA 90-80 MCG/ACT IN AERO: 2.0000 | INHALATION_SPRAY | RESPIRATORY_TRACT | 2 refills | Status: DC | PRN

## 2024-04-04 NOTE — Patient Instructions (Addendum)
 Breathing Daily controller medication(s): Pulmicort  1 puff twice a day and rinse mouth after each.  Continue Singulair  (montelukast ) 10mg  daily at night. May use Airsupra  rescue inhaler 2 puffs every 4 to 6 hours as needed for shortness of breath, chest tightness, coughing, and wheezing. Do not use more than 12 puffs in 24 hours. May use Airsupra  rescue inhaler 2 puffs 5 to 15 minutes prior to strenuous physical activities. Rinse mouth after each use.  Coupon given.  Monitor frequency of use - if you need to use it more than twice per week on a consistent basis let us  know.  Asthma control goals:  Full participation in all desired activities (may need albuterol  before activity) Albuterol  use two times or less a week on average (not counting use with activity) Cough interfering with sleep two times or less a month Oral steroids no more than once a year No hospitalizations   Environmental allergies 2023 skin testing was positive to dust mites.  Continue environmental control measures.  Use over the counter antihistamines such as Zyrtec (cetirizine), Claritin (loratadine), Allegra (fexofenadine), or Xyzal (levocetirizine) daily as needed. May take twice a day during allergy  flares. May switch antihistamines every few months. Continue Singulair  (montelukast ) 10mg  daily at night. May take Flonase Sensamist nasal spray 1 spray per nostril 1-2 times a day as needed for nasal congestion.  Nasal saline spray (i.e., Simply Saline) or nasal saline lavage (i.e., NeilMed) is recommended as needed and prior to medicated nasal sprays. Will discuss odactra at next visit.   Food 2023 skin testing: Negative to soy and apple.  Continue to avoid foods that are bothersome - soy.  Limit red meat and gluten.   Follow up in 3 months or sooner if needed.  Control of House Dust Mite Allergen Dust mite allergens are a common trigger of allergy  and asthma symptoms. While they can be found throughout the  house, these microscopic creatures thrive in warm, humid environments such as bedding, upholstered furniture and carpeting. Because so much time is spent in the bedroom, it is essential to reduce mite levels there.  Encase pillows, mattresses, and box springs in special allergen-proof fabric covers or airtight, zippered plastic covers.  Bedding should be washed weekly in hot water (130 F) and dried in a hot dryer. Allergen-proof covers are available for comforters and pillows that can't be regularly washed.  Wash the allergy -proof covers every few months. Minimize clutter in the bedroom. Keep pets out of the bedroom.  Keep humidity less than 50% by using a dehumidifier or air conditioning. You can buy a humidity measuring device called a hygrometer to monitor this.  If possible, replace carpets with hardwood, linoleum, or washable area rugs. If that's not possible, vacuum frequently with a vacuum that has a HEPA filter. Remove all upholstered furniture and non-washable window drapes from the bedroom. Remove all non-washable stuffed toys from the bedroom.  Wash stuffed toys weekly.

## 2024-04-19 ENCOUNTER — Encounter: Payer: Self-pay | Admitting: Cardiology

## 2024-04-26 ENCOUNTER — Other Ambulatory Visit (HOSPITAL_COMMUNITY): Payer: 59

## 2024-05-02 ENCOUNTER — Ambulatory Visit (HOSPITAL_COMMUNITY)
Admission: RE | Admit: 2024-05-02 | Discharge: 2024-05-02 | Disposition: A | Discharge status: Home / Self Care | Source: Ambulatory Visit | Attending: Cardiology | Admitting: Cardiology

## 2024-05-02 ENCOUNTER — Ambulatory Visit: Payer: Self-pay | Admitting: Cardiology

## 2024-05-02 DIAGNOSIS — I083 Combined rheumatic disorders of mitral, aortic and tricuspid valves: Secondary | ICD-10-CM

## 2024-05-02 DIAGNOSIS — I351 Nonrheumatic aortic (valve) insufficiency: Secondary | ICD-10-CM | POA: Insufficient documentation

## 2024-05-02 LAB — ECHOCARDIOGRAM COMPLETE
AR max vel: 1.22 cm2
AV Area VTI: 1.22 cm2
AV Area mean vel: 1.23 cm2
AV Mean grad: 7 mmHg
AV Peak grad: 12.7 mmHg
Ao pk vel: 1.78 m/s
Area-P 1/2: 4.58 cm2
MV M vel: 5.58 m/s
MV Peak grad: 124.5 mmHg
P 1/2 time: 548 ms
S' Lateral: 2.6 cm

## 2024-05-03 LAB — LIPID PANEL
Chol/HDL Ratio: 3.4 ratio (ref 0.0–4.4)
Cholesterol, Total: 251 mg/dL — ABNORMAL HIGH (ref 100–199)
HDL: 74 mg/dL (ref >39)
LDL Chol Calc (NIH): 164 mg/dL — ABNORMAL HIGH (ref 0–99)
Triglycerides: 76 mg/dL (ref 0–149)
VLDL Cholesterol Cal: 13 mg/dL (ref 5–40)

## 2024-05-10 ENCOUNTER — Encounter: Payer: Self-pay | Admitting: *Deleted

## 2024-05-10 NOTE — Telephone Encounter (Signed)
 Called patient and unable to leave voicemail. Sent patient a message through [employer].

## 2024-05-12 ENCOUNTER — Other Ambulatory Visit: Payer: Self-pay | Admitting: Family Medicine

## 2024-05-15 NOTE — Progress Notes (Unsigned)
 Cardiology Office Note:    Date:  05/15/2024   ID:  Shelly Combs, DOB 07-30-1966, MRN [id number]  PCP:  Micheal Wolm ORN, MD  Cardiologist:  Lonni LITTIE Nanas, MD  Electrophysiologist:  None   Referring MD: Micheal Wolm ORN, MD   No chief complaint on file.   History of Present Illness:    Shelly Combs is a 58 y.o. female with a hx of asthma who presents for follow-up.  She was referred by Dr. Micheal for evaluation of aortic regurgitation, initially seen 06/2023.  Echocardiogram 05/01/2023 showed EF 60 to 65%, normal RV function, moderate aortic regurgitation, mild mitral regurgitation.  At initial clinic visit, reported having chest pain and palpitations.  Coronary CTA on 07/10/2023 showed normal coronary arteries, calcium score 0.  Zio patch x 6 days 06/2023 showed no significant arrhythmias.  Echocardiogram 2025 showed EF 60 to 65%, normal RV function, moderate aortic regurgitation.  Since last clinic visit,  she reports she is doing.  Denies any further chest pain or dyspnea.  Reports occasional palpitations about once per week that just last for few seconds.  She started working out once per week with Systems analyst and is made dietary changes.  Past Medical History:  Diagnosis Date   Allergy     Asthma    Cardiac arrhythmia due to congenital heart disease    Chronic back pain    Uterine fibroid     Past Surgical History:  Procedure Laterality Date   GANGLION CYST EXCISION  1980   HYSTEROSCOPY  1999   uterine fibroids    Current Medications: No outpatient medications have been marked as taking for the 05/18/24 encounter (Appointment) with Nanas Lonni LITTIE, MD.     Allergies:   Pollen extract, Dust mite extract, Latex, and Tree extract   Social History   Socioeconomic History   Marital status: Married    Spouse name: Not on file   Number of children: Not on file   Years of education: Not on file   Highest education level: Doctorate  Occupational  History   Not on file  Tobacco Use   Smoking status: Never    Passive exposure: Never   Smokeless tobacco: Never  Vaping Use   Vaping status: Never Used  Substance and Sexual Activity   Alcohol use: Yes    Alcohol/week: 2.0 standard drinks of alcohol    Types: 2 Glasses of wine per week    Comment: 2 glasses of wine a night    Drug use: No   Sexual activity: Yes    Partners: Male    Birth control/protection: Post-menopausal    Comment: 1st intercourse- 19, partners- more than 5  Other Topics Concern   Not on file  Social History Narrative   Not on file   Social Drivers of Health   Financial Resource Strain: Low Risk  (11/02/2023)   Overall Financial Resource Strain (CARDIA)    Difficulty of Paying Living Expenses: Not hard at all  Food Insecurity: No Food Insecurity (11/02/2023)   Hunger Vital Sign    Worried About Running Out of Food in the Last Year: Never true    Ran Out of Food in the Last Year: Never true  Transportation Needs: No Transportation Needs (11/02/2023)   PRAPARE - Administrator, Civil Service (Medical): No    Lack of Transportation (Non-Medical): No  Physical Activity: Insufficiently Active (11/02/2023)   Exercise Vital Sign    Days of Exercise per Week:  2 days    Minutes of Exercise per Session: 30 min  Stress: No Stress Concern Present (11/02/2023)   [employer] of Occupational Health - Occupational Stress Questionnaire    Feeling of Stress : Only a little  Social Connections: Socially Integrated (11/02/2023)   Social Connection and Isolation Panel    Frequency of Communication with Friends and Family: Twice a week    Frequency of Social Gatherings with Friends and Family: Three times a week    Attends Religious Services: More than 4 times per year    Active Member of Clubs or Organizations: Yes    Attends Engineer, structural: More than 4 times per year    Marital Status: Married     Family History: The patient's family  history includes Arthritis in an other family member; Atrial fibrillation in her mother; Cancer in her paternal grandfather and another family member; Heart disease in an other family member; Mental illness in an other family member.  ROS:   Please see the history of present illness.     All other systems reviewed and are negative.  EKGs/Labs/Other Studies Reviewed:    The following studies were reviewed today:   EKG:   06/15/23: Normal sinus rhythm, Q waves V1/2, rate 74  Recent Labs: 06/15/2023: BUN 15; Creatinine, Ser 0.97; Potassium 4.8; Sodium 143  Recent Lipid Panel    Component Value Date/Time   CHOL 251 (H) 05/02/2024 0955   TRIG 76 05/02/2024 0955   HDL 74 05/02/2024 0955   CHOLHDL 3.4 05/02/2024 0955   CHOLHDL 3 04/03/2021 0957   VLDL 15.2 04/03/2021 0957   LDLCALC 164 (H) 05/02/2024 0955    Physical Exam:    VS:  LMP 12/31/2017 Comment: sexually active    Wt Readings from Last 3 Encounters:  04/04/24 155 lb 6.4 oz (70.5 kg)  12/18/23 153 lb 6.4 oz (69.6 kg)  11/03/23 154 lb 6.4 oz (70 kg)     GEN:  Well nourished, well developed in no acute distress HEENT: Normal NECK: No JVD; No carotid bruits LYMPHATICS: No lymphadenopathy CARDIAC: RRR, no murmurs, rubs, gallops RESPIRATORY:  Clear to auscultation without rales, wheezing or rhonchi  ABDOMEN: Soft, non-tender, non-distended MUSCULOSKELETAL:  No edema; No deformity  SKIN: Warm and dry NEUROLOGIC:  Alert and oriented x 3 PSYCHIATRIC:  Normal affect   ASSESSMENT:    No diagnosis found.   PLAN:    Chest pain: Atypical in description.  Coronary CTA on 07/10/2023 showed normal coronary arteries, calcium score 0.  Palpitations:   Zio patch x 6 days 06/2023 showed no significant arrhythmias.  Aortic regurgitation: Moderate AI on echo 04/2023.  Echo 04/2024 showed aortic regurgitation remain moderate  Hyperlipidemia: LDL 167 on 06/15/2023.  10-year ASCVD risk score 2%.  Normal coronary arteries on coronary  CTA as above.  Diet/exercise recommended.  RTC in 6 months***  Medication Adjustments/Labs and Tests Ordered: Current medicines are reviewed at length with the patient today.  Concerns regarding medicines are outlined above.  No orders of the defined types were placed in this encounter.  No orders of the defined types were placed in this encounter.   There are no Patient Instructions on file for this visit.   Signed, Lonni LITTIE Nanas, MD  05/15/2024 2:22 PM    [facility]

## 2024-05-18 ENCOUNTER — Encounter (HOSPITAL_BASED_OUTPATIENT_CLINIC_OR_DEPARTMENT_OTHER): Payer: Self-pay | Admitting: Cardiology

## 2024-05-18 ENCOUNTER — Ambulatory Visit (INDEPENDENT_AMBULATORY_CARE_PROVIDER_SITE_OTHER): Admitting: Cardiology

## 2024-05-18 VITALS — BP 116/68 | HR 73 | Resp 16 | Ht 61.0 in | Wt 156.0 lb

## 2024-05-18 DIAGNOSIS — I351 Nonrheumatic aortic (valve) insufficiency: Secondary | ICD-10-CM | POA: Diagnosis not present

## 2024-05-18 DIAGNOSIS — R0683 Snoring: Secondary | ICD-10-CM

## 2024-05-18 DIAGNOSIS — E785 Hyperlipidemia, unspecified: Secondary | ICD-10-CM

## 2024-05-18 DIAGNOSIS — R400 Somnolence: Secondary | ICD-10-CM

## 2024-05-18 DIAGNOSIS — R079 Chest pain, unspecified: Secondary | ICD-10-CM

## 2024-05-18 DIAGNOSIS — R002 Palpitations: Secondary | ICD-10-CM

## 2024-05-18 NOTE — Patient Instructions (Signed)
 Medication Instructions:  Your physician recommends that you continue on your current medications as directed. Please refer to the Current Medication list given to you today.   *If you need a refill on your cardiac medications before your next appointment, please call your pharmacy*  Lab Work: NONE   Testing/Procedures: Your physician has requested that you have an echocardiogram. Echocardiography is a painless test that uses sound waves to create images of your heart. It provides your doctor with information about the size and shape of your heart and how well your heart's chambers and valves are working. This procedure takes approximately one hour. There are no restrictions for this procedure. Please do NOT wear cologne, perfume, aftershave, or lotions (deodorant is allowed). Please arrive 15 minutes prior to your appointment time.  Please note: We ask at that you not bring children with you during ultrasound (echo/ vascular) testing. Due to room size and safety concerns, children are not allowed in the ultrasound rooms during exams. Our front office staff cannot provide observation of children in our lobby area while testing is being conducted. An adult accompanying a patient to their appointment will only be allowed in the ultrasound room at the discretion of the ultrasound technician under special circumstances. We apologize for any inconvenience. TO BE DONE IN 1 YEAR   ITAMAR HOME SLEEP STUDY, THIS WILL BE MAILED TO YOU   Follow-Up: At [facility], you and your health needs are our priority.  As part of our continuing mission to provide you with exceptional heart care, our providers are all part of one team.  This team includes your primary Cardiologist (physician) and Advanced Practice Providers or APPs (Physician Assistants and Nurse Practitioners) who all work together to provide you with the care you need, when you need it.  Your next appointment:   12 month(s) ABOUT A WEEK  AFTER YOUR ECHO   We recommend signing up for the patient portal called MyChart.  Sign up information is provided on this After Visit Summary.  MyChart is used to connect with patients for Virtual Visits (Telemedicine).  Patients are able to view lab/test results, encounter notes, upcoming appointments, etc.  Non-urgent messages can be sent to your provider as well.   To learn more about what you can do with MyChart, go to [email or web address].   Other Instructions WatchPAT?  Is a FDA cleared portable home sleep study test that uses a watch and 3 points of contact to monitor 7 different channels, including your heart rate, oxygen saturations, body position, snoring, and chest motion.  The study is easy to use from the comfort of your own home and accurately detect sleep apnea.  Before bed, you attach the chest sensor, attached the sleep apnea bracelet to your nondominant hand, and attach the finger probe.  After the study, the raw data is downloaded from the watch and scored for apnea events.   For more information: [email or web address]  Patient Testing Instructions:  Do not put battery into the device until bedtime when you are ready to begin the test. Please call the support number if you need assistance after following the instructions below: 24 hour support line- [phone] or [facility] support at [phone] (option 2)  Download the [facility] WatchPAT One app through the google play store or App Store  Be sure to turn on or enable access to bluetooth in settlings on your smartphone/ device  Make sure no other bluetooth devices are on and within the vicinity of your smartphone/ device and  WatchPAT watch during testing.  Make sure to leave your smart phone/ device plugged in and charging all night.  When ready for bed:  Follow the instructions step by step in the WatchPAT One App to activate the testing device. For additional instructions, including video instruction,  visit the WatchPAT One video on Youtube. You can search for WatchPat One within Youtube (video is 4 minutes and 18 seconds) or enter: https://youtube/watch?[email or web address]_vbiwxE Please note: You will be prompted to enter a Pin to connect via bluetooth when starting the test. The PIN will be assigned to you when you receive the test.  The device is disposable, but it recommended that you retain the device until you receive a call letting you know the study has been received and the results have been interpreted.  We will let you know if the study did not transmit to us  properly after the test is completed. You do not need to call us  to confirm the receipt of the test.  Please complete the test within 48 hours of receiving PIN.   Frequently Asked Questions:  What is Watch Bruna one?  A single use fully disposable home sleep apnea testing device and will not need to be returned after completion.  What are the requirements to use WatchPAT one?  The be able to have a successful watchpat one sleep study, you should have your Watch pat one device, your smart phone, watch pat one app, your PIN number and Internet access What type of phone do I need?  You should have a smart phone that uses Android 5.1 and above or any Iphone with IOS 10 and above How can I download the WatchPAT one app?  Based on your device type search for WatchPAT one app either in google play for android devices or APP store for Iphone's Where will I get my PIN for the study?  Your PIN will be provided by your physician's office. It is used for authentication and if you lose/forget your PIN, please reach out to your providers office.  I do not have Internet at home. Can I do WatchPAT one study?  WatchPAT One needs Internet connection throughout the night to be able to transmit the sleep data. You can use your home/local internet or your cellular's data package. However, it is always recommended to use home/local Internet. It is estimated that  between 20MB-30MB will be used with each study.However, the application will be looking for space in the phone to start the study.  What happens if I lose internet or bluetooth connection?  During the internet disconnection, your phone will not be able to transmit the sleep data. All the data, will be stored in your phone. As soon as the internet connection is back on, the phone will being sending the sleep data. During the bluetooth disconnection, WatchPAT one will not be able to to send the sleep data to your phone. Data will be kept in the WatchPAT one until two devices have bluetooth connection back on. As soon as the connection is back on, WatchPAT one will send the sleep data to the phone.  How long do I need to wear the WatchPAT one?  After you start the study, you should wear the device at least 6 hours.  How far should I keep my phone from the device?  During the night, your phone should be within 15 feet.  What happens if I leave the room for restroom or other reasons?  Leaving the room for any  reason will not cause any problem. As soon as your get back to the room, both devices will reconnect and will continue to send the sleep data. Can I use my phone during the sleep study?  Yes, you can use your phone as usual during the study. But it is recommended to put your watchpat one on when you are ready to go to bed.  How will I get my study results?  A soon as you completed your study, your sleep data will be sent to the provider. They will then share the results with you when they are ready.

## 2024-06-15 ENCOUNTER — Encounter (HOSPITAL_BASED_OUTPATIENT_CLINIC_OR_DEPARTMENT_OTHER): Payer: Self-pay | Admitting: Cardiology

## 2024-06-17 ENCOUNTER — Encounter: Payer: Self-pay | Admitting: Family Medicine

## 2024-06-17 ENCOUNTER — Ambulatory Visit: Admitting: Family Medicine

## 2024-06-17 VITALS — BP 124/80 | HR 71 | Temp 97.6°F | Ht 61.02 in | Wt 151.7 lb

## 2024-06-17 DIAGNOSIS — Z23 Encounter for immunization: Secondary | ICD-10-CM

## 2024-06-17 DIAGNOSIS — Z0000 Encounter for general adult medical examination without abnormal findings: Secondary | ICD-10-CM

## 2024-06-17 NOTE — Progress Notes (Signed)
 Established Patient Office Visit  Subjective   Patient ID: Shelly Combs, female    DOB: December 12, 1965  Age: 58 y.o. MRN: [id number]  Chief Complaint  Patient presents with   Annual Exam    HPI   Shelly Combs is seen for physical exam.  She is followed by gynecologist and has follow-up with her gynecologist in December.  Generally doing well.  She has had a very stressful time with losing her father to dementia earlier this year.  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  Her son has had some recent issues with alcohol abuse.  Ariatna has history of aortic heart murmur and echo showed moderate aortic valve regurgitation.  She is followed by cardiology.  She has history of high cholesterol but no plaque by CT morphology study.  She has pending home study to rule out obstructive sleep apnea.  She also has history of perennial allergies and is followed by allergist.  She sees a dermatologist annually for skin cancer screening.  She is on estrogen replacement per her gynecologist.  Health maintenance reviewed:  Health Maintenance  Topic Date Due   COVID-19 Vaccine (1) Never done   HIV Screening  Never done   Pneumococcal Vaccine: 50+ Years (1 of 2 - PCV) Never done   Hepatitis B Vaccines 19-59 Average Risk (1 of 3 - 19+ 3-dose series) Never done   Zoster Vaccines- Shingrix (1 of 2) Never done   Colonoscopy  Never done   Hepatitis C Screening  06/17/2025 (Originally 09/11/1983)   Cervical Cancer Screening (HPV/Pap Cotest)  04/01/2025   Mammogram  11/17/2025   DTaP/Tdap/Td (2 - Td or Tdap) 06/17/2034   Influenza Vaccine  Completed   HPV VACCINES  Aged Out   Meningococcal B Vaccine  Aged Out   - Needs flu vaccine today and also due for tetanus. - Discussed current guidelines for pneumonia vaccine and also recommend Shingrix and she will consider those later this year - She did Cologuard 9/24 which was negative  Family history-father died age 63  of frontal lobe dementia.  Mom is 20 and alive and has A-fib history.  She has 2 sisters and 2 brothers.  1 brother has bipolar 1 and had some type of leukemia and type 2 diabetes.  Social history-married and has 1 son.  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  Never smoked.  Occasional alcohol use.  Still exercises regularly.  Past Medical History:  Diagnosis Date   Allergy     Asthma    Cardiac arrhythmia due to congenital heart disease    Chronic back pain    Uterine fibroid    Past Surgical History:  Procedure Laterality Date   GANGLION CYST EXCISION  1980   HYSTEROSCOPY  1999   uterine fibroids    reports that she has never smoked. She has never been exposed to tobacco smoke. She has never used smokeless tobacco. She reports current alcohol use of about 2.0 standard drinks of alcohol per week. She reports that she does not use drugs. family history includes Arthritis in an other family member; Atrial fibrillation in her mother; Cancer in her paternal grandfather and another family member; Heart disease in her brother and another family member; Mental illness in an other family member. Allergies  Allergen Reactions   Pollen Extract Cough, Itching and Shortness Of Breath   Dust Mite Extract Itching   Latex  Other reaction(s): itching   Tree Extract Itching     Review of Systems  Constitutional:  Negative for chills, fever, malaise/fatigue and weight loss.  HENT:  Negative for hearing loss.   Eyes:  Negative for blurred vision and double vision.  Respiratory:  Negative for cough and shortness of breath.   Cardiovascular:  Negative for chest pain, palpitations and leg swelling.  Gastrointestinal:  Negative for abdominal pain, blood in stool, constipation and diarrhea.  Genitourinary:  Negative for dysuria.  Skin:  Negative for rash.  Neurological:  Negative for dizziness, speech change, seizures, loss of consciousness and headaches.   Psychiatric/Behavioral:  Negative for depression.       Objective:     BP 124/80   Pulse 71   Temp 97.6 F (36.4 C) (Oral)   Ht 5' 1.02 (1.55 m)   Wt 151 lb 11.2 oz (68.8 kg)   LMP 12/31/2017 Comment: sexually active  SpO2 99%   BMI 28.64 kg/m  BP Readings from Last 3 Encounters:  06/17/24 124/80  05/18/24 116/68  04/04/24 118/78   Wt Readings from Last 3 Encounters:  06/17/24 151 lb 11.2 oz (68.8 kg)  05/18/24 156 lb (70.8 kg)  04/04/24 155 lb 6.4 oz (70.5 kg)      Physical Exam Vitals reviewed.  Constitutional:      General: She is not in acute distress.    Appearance: She is well-developed. She is not ill-appearing.  HENT:     Head: Normocephalic and atraumatic.     Right Ear: Tympanic membrane normal.     Left Ear: Tympanic membrane normal.  Eyes:     Pupils: Pupils are equal, round, and reactive to light.  Neck:     Thyroid : No thyromegaly.  Cardiovascular:     Rate and Rhythm: Normal rate and regular rhythm.     Heart sounds: Normal heart sounds. No murmur heard. Pulmonary:     Effort: No respiratory distress.     Breath sounds: Normal breath sounds. No wheezing or rales.  Abdominal:     General: Bowel sounds are normal. There is no distension.     Palpations: Abdomen is soft. There is no mass.     Tenderness: There is no abdominal tenderness. There is no guarding or rebound.  Musculoskeletal:        General: Normal range of motion.     Cervical back: Normal range of motion and neck supple.  Lymphadenopathy:     Cervical: No cervical adenopathy.  Skin:    Findings: No rash.  Neurological:     Mental Status: She is alert and oriented to person, place, and time.     Cranial Nerves: No cranial nerve deficit.  Psychiatric:        Behavior: Behavior normal.        Thought Content: Thought content normal.        Judgment: Judgment normal.      No results found for any visits on 06/17/24.  Last lipids Lab Results  Component Value Date    CHOL 251 (H) 05/02/2024   HDL 74 05/02/2024   LDLCALC 164 (H) 05/02/2024   TRIG 76 05/02/2024   CHOLHDL 3.4 05/02/2024      The 10-year ASCVD risk score (Arnett DK, et al., 2019) is: 2.4%    Assessment & Plan:   Problem List Items Addressed This Visit   None Visit Diagnoses       Need for influenza vaccination    -  Primary  Relevant Orders   Flu vaccine trivalent PF, 6mos and older(Flulaval,Afluria,Fluarix,Fluzone) (Completed)     Physical exam       Relevant Orders   CBC with Differential/Platelet   CMP   Hemoglobin A1c     58 year old female here for physical.  She sees GYN regularly.  We discussed several health maintenance issues as follows  -Reviewed her lipids recently through cardiology.  She has high cholesterol but coronary calcium score of 0 and overall fairly low risk for CAD.  Calculated 10-year risk for MI 2.4%.  Continue heart healthy low saturated fat diet - Recommend minimum 150 minutes/week of moderate intensity exercise - Recommend flu vaccine and Tdap and patient consents - Consider pneumonia vaccine and Shingrix later this year - Obtain further labs including A1c, CBC, CMP - Cologuard -9/24.  Repeat in 2 years - She is getting mammograms and Pap smears through GYN  No follow-ups on file.    Wolm Scarlet, MD

## 2024-06-17 NOTE — Patient Instructions (Signed)
 Consider Pneumonia vaccine and Shingrix vaccines at some point in the next year.

## 2024-06-18 ENCOUNTER — Ambulatory Visit: Payer: Self-pay | Admitting: Family Medicine

## 2024-06-18 LAB — COMPREHENSIVE METABOLIC PANEL WITH GFR
AG Ratio: 1.8 (calc) (ref 1.0–2.5)
ALT: 21 U/L (ref 6–29)
AST: 17 U/L (ref 10–35)
Albumin: 4.4 g/dL (ref 3.6–5.1)
Alkaline phosphatase (APISO): 59 U/L (ref 37–153)
BUN: 16 mg/dL (ref 7–25)
CO2: 27 mmol/L (ref 20–32)
Calcium: 9.9 mg/dL (ref 8.6–10.4)
Chloride: 106 mmol/L (ref 98–110)
Creat: 0.89 mg/dL (ref 0.50–1.03)
Globulin: 2.4 g/dL (ref 1.9–3.7)
Glucose, Bld: 84 mg/dL (ref 65–99)
Potassium: 4.5 mmol/L (ref 3.5–5.3)
Sodium: 140 mmol/L (ref 135–146)
Total Bilirubin: 0.3 mg/dL (ref 0.2–1.2)
Total Protein: 6.8 g/dL (ref 6.1–8.1)
eGFR: 75 mL/min/1.73m2 (ref >=60)

## 2024-06-18 LAB — CBC WITH DIFFERENTIAL/PLATELET
Absolute Lymphocytes: 2759 {cells}/uL (ref 850–3900)
Absolute Monocytes: 745 {cells}/uL (ref 200–950)
Basophils Absolute: 99 {cells}/uL (ref 0–200)
Basophils Relative: 1.3 %
Eosinophils Absolute: 198 {cells}/uL (ref 15–500)
Eosinophils Relative: 2.6 %
HCT: 42.2 % (ref 35.0–45.0)
Hemoglobin: 14.5 g/dL (ref 11.7–15.5)
MCH: 31.9 pg (ref 27.0–33.0)
MCHC: 34.4 g/dL (ref 32.0–36.0)
MCV: 93 fL (ref 80.0–100.0)
MPV: 10.6 fL (ref 7.5–12.5)
Monocytes Relative: 9.8 %
Neutro Abs: 3800 {cells}/uL (ref 1500–7800)
Neutrophils Relative %: 50 %
Platelets: 290 Thousand/uL (ref 140–400)
RBC: 4.54 Million/uL (ref 3.80–5.10)
RDW: 11.8 % (ref 11.0–15.0)
Total Lymphocyte: 36.3 %
WBC: 7.6 Thousand/uL (ref 3.8–10.8)

## 2024-06-18 LAB — HEMOGLOBIN A1C
Hgb A1c MFr Bld: 5.2 % (ref <5.7)
Mean Plasma Glucose: 103 mg/dL
eAG (mmol/L): 5.7 mmol/L

## 2024-06-21 ENCOUNTER — Ambulatory Visit: Admitting: Family Medicine

## 2024-06-21 ENCOUNTER — Encounter: Payer: Self-pay | Admitting: Family Medicine

## 2024-06-21 ENCOUNTER — Other Ambulatory Visit: Payer: Self-pay | Admitting: Allergy

## 2024-06-21 VITALS — BP 126/80 | HR 80 | Temp 98.2°F | Wt 156.7 lb

## 2024-06-21 DIAGNOSIS — B029 Zoster without complications: Secondary | ICD-10-CM | POA: Diagnosis not present

## 2024-06-21 MED ORDER — VALACYCLOVIR HCL 1 G PO TABS: 1000.0000 mg | ORAL_TABLET | Freq: Three times a day (TID) | ORAL | 0 refills | Status: DC

## 2024-06-21 NOTE — Progress Notes (Signed)
 Established Patient Office Visit  Subjective   Patient ID: Shelly Combs, female    DOB: 1965-11-24  Age: 58 y.o. MRN: [id number]  Chief Complaint  Patient presents with   Herpes Zoster    HPI   Ercel is seen for concern for possible shingles rash.  First noticed a little rash few days ago.  She has had some burning type pain left upper thoracic back radiating toward the left axilla and toward the left anterior chest.  She noticed a couple areas of rash including left axilla and left anterior chest.  Initially thought this may have been a bug bite.  No fever.  Did have chickenpox as a child but no history of shingles vaccine   pain is tolerable at this point and she has been taking some Aleve  which helps  Past Medical History:  Diagnosis Date   Allergy     Asthma    Cardiac arrhythmia due to congenital heart disease    Chronic back pain    Uterine fibroid    Past Surgical History:  Procedure Laterality Date   GANGLION CYST EXCISION  1980   HYSTEROSCOPY  1999   uterine fibroids    reports that she has never smoked. She has never been exposed to tobacco smoke. She has never used smokeless tobacco. She reports current alcohol use of about 2.0 standard drinks of alcohol per week. She reports that she does not use drugs. family history includes Arthritis in an other family member; Atrial fibrillation in her mother; Cancer in her paternal grandfather and another family member; Heart disease in her brother and another family member; Mental illness in an other family member. Allergies  Allergen Reactions   Pollen Extract Cough, Itching and Shortness Of Breath   Dust Mite Extract Itching   Latex     Other reaction(s): itching   Tree Extract Itching    Review of Systems  Constitutional:  Negative for chills and fever.  Skin:  Positive for rash.      Objective:     BP 126/80   Pulse 80   Temp 98.2 F (36.8 C) (Oral)   Wt 156 lb 11.2 oz (71.1 kg)   LMP 12/31/2017 Comment:  sexually active  SpO2 99%   BMI 29.58 kg/m  BP Readings from Last 3 Encounters:  06/21/24 126/80  06/17/24 124/80  05/18/24 116/68   Wt Readings from Last 3 Encounters:  06/21/24 156 lb 11.2 oz (71.1 kg)  06/17/24 151 lb 11.2 oz (68.8 kg)  05/18/24 156 lb (70.8 kg)      Physical Exam Vitals reviewed.  Constitutional:      General: She is not in acute distress.    Appearance: She is not ill-appearing.  Cardiovascular:     Rate and Rhythm: Normal rate and regular rhythm.  Skin:    Comments: She has a small patch of superficial vesicles left axillary region.  Very faint erythematous rash left anterior chest.  Difficult to ascertain if these are vesicular.  This looks more almost like prominent telangiectasias.  Neurological:     Mental Status: She is alert.      No results found for any visits on 06/21/24.    The 10-year ASCVD risk score (Arnett DK, et al., 2019) is: 2.5%    Assessment & Plan:   Fine vesicular rash left axillary region.  Suspect she probably does have early case of shingles.  She has pain in dermatome type distribution radiating posterior to anterior.  Start  Valtrex 1 g 3 times daily for 7 days.  Continue over-the-counter nonsteroidal.  Be in touch if she has any progressive pain or other concerns.  We did discuss still getting shingles vaccine later perhaps in the next year or 2  Wolm Scarlet, MD

## 2024-07-03 NOTE — Progress Notes (Unsigned)
 Follow Up Note  RE: Shelly Combs MRN: [id number] DOB: [birth year] Date of Office Visit: 07/04/2024  Referring provider: Micheal Wolm ORN, MD Primary care provider: Micheal Wolm ORN, MD  Chief Complaint: No chief complaint on file.  History of Present Illness: I had the pleasure of seeing Shelly Combs for a follow up visit at the [facility] on 07/04/2024. Shelly Combs is a 58 y.o. female, who is being followed for asthma, allergic rhinitis, adverse food reaction. Shelly Combs previous allergy  office visit was on 04/04/2024 with Dr. Luke. Today is a regular follow up visit.  Discussed the use of AI scribe software for clinical note transcription with the patient, who gave verbal consent to proceed.  History of Present Illness            ***  Assessment and Plan: Shelly Combs is a 58 y.o. female with: Asthma Past history - Daily symptoms with chest tightness, wheezing, coughing.  Currently on Singulair  daily.  Used to be on Qvar  in the past but still had symptoms.  Had COVID-19 twice. 2023 spirometry showed some restriction with 6% improvement FEV1 postbronchodilator treatment.  Clinically feeling improved. Interim history - symptoms worsened due to inconsistent Pulmicort  use. No recent ER visits or steroid use. Diagnosed with aortic regurgitation.  Today's spirometry was normal but not as good as previous one.  Daily controller medication(s): Pulmicort  1 puff twice a day and rinse mouth after each.  Continue Singulair  (montelukast ) 10mg  daily at night. May use Airsupra  rescue inhaler 2 puffs every 4 to 6 hours as needed for shortness of breath, chest tightness, coughing, and wheezing. Do not use more than 12 puffs in 24 hours. May use Airsupra  rescue inhaler 2 puffs 5 to 15 minutes prior to strenuous physical activities. Rinse mouth after each use.  Coupon given.  Monitor frequency of use - if you need to use it more than twice per week on a consistent basis let us  know.     Perennial allergic rhinitis Past history - Perennial rhinoconjunctivitis symptoms for 30+ years with worsening in the spring.  Tried Singulair , Flonase, Claritin with some benefit. No prior AIT. 2023 skin testing positive to dust mites.  Interim history - considering starting Odactra but insurance changed.  Continue environmental control measures.  Use over the counter antihistamines such as Zyrtec (cetirizine), Claritin (loratadine), Allegra (fexofenadine), or Xyzal (levocetirizine) daily as needed. May take twice a day during allergy  flares. May switch antihistamines every few months. Continue Singulair  (montelukast ) 10mg  daily at night. May take Flonase Sensamist nasal spray 1 spray per nostril 1-2 times a day as needed for nasal congestion.  Nasal saline spray (i.e., Simply Saline) or nasal saline lavage (i.e., NeilMed) is recommended as needed and prior to medicated nasal sprays. Will discuss odactra at next visit again - need asthma to be under good control prior to starting. First dose given in office with 30 minute wait then it's once a day dosing at home.    Other adverse food reactions, not elsewhere classified, subsequent encounter Past history - Noted perioral pruritus with soy and fresh apples. Chobani yogurt and leftover fish caused dizziness in the past. Noted increased allergic symptoms after red meat consumption. Alpha gal in the past was negative. 2023 skin testing negative to soy and apple. 2023 bloodwork negative to alpha gal. Interim history -  no reactions. Eating apples, yogurt and fish.  Continue to avoid foods that are bothersome - soy.  Limit red meat and gluten.  Assessment and Plan              No follow-ups on file.  No orders of the defined types were placed in this encounter.  Lab Orders  No laboratory test(s) ordered today    Diagnostics: Spirometry:  Tracings reviewed. Shelly Combs effort: {Blank single:19197::Good reproducible efforts.,It was hard to  get consistent efforts and there is a question as to whether this reflects a maximal maneuver.,Poor effort, data can not be interpreted.} FVC: ***L FEV1: ***L, ***% predicted FEV1/FVC ratio: ***% Interpretation: {Blank single:19197::Spirometry consistent with mild obstructive disease,Spirometry consistent with moderate obstructive disease,Spirometry consistent with severe obstructive disease,Spirometry consistent with possible restrictive disease,Spirometry consistent with mixed obstructive and restrictive disease,Spirometry uninterpretable due to technique,Spirometry consistent with normal pattern,No overt abnormalities noted given today's efforts}.  Please see scanned spirometry results for details.  Skin Testing: {Blank single:19197::Select foods,Environmental allergy  panel,Environmental allergy  panel and select foods,Food allergy  panel,None,Deferred due to recent antihistamines use}. *** Results discussed with patient/family.   Medication List:  Current Outpatient Medications  Medication Sig Dispense Refill  . AIRSUPRA  90-80 MCG/ACT AERO INHALE 2 PUFFS INTO THE LUNGS EVERY 4 HOURS AS NEEDED FOR COUGHING OR WHEEZING OR CHEST TIGHTNESS. DO NOT EXCEED 12 PUFFS IN 24 HOURS 10.7 g 2  . albuterol  (VENTOLIN  HFA) 108 (90 Base) MCG/ACT inhaler Inhale 2 puffs into the lungs every 4 (four) hours as needed for wheezing or shortness of breath (coughing fits). 18 g 1  . budesonide  (PULMICORT  FLEXHALER) 180 MCG/ACT inhaler Inhale 1 puff into the lungs in the morning and at bedtime. Rinse mouth after each use. 1 each 5  . CALCIUM PO Take 1 tablet by mouth daily.    . citalopram  (CELEXA ) 20 MG tablet TAKE 1 TABLET(20 MG) BY MOUTH DAILY 90 tablet 1  . doxycycline  (VIBRAMYCIN ) 100 MG capsule Take 1 capsule (100 mg total) by mouth 2 (two) times daily. 60 capsule 0  . Dust Mite Mixed Allergen Ext (ODACTRA) 12 SQ-HDM SUBL Place 1 tablet under the tongue daily. First dose must be  taken in the allergy  office. (Patient taking differently: Place 1 tablet under the tongue as needed. First dose must be taken in the allergy  office.) 30 tablet 5  . estradiol  (VIVELLE -DOT) 0.05 MG/24HR patch Place 1 patch (0.05 mg total) onto the skin 2 (two) times a week. 24 patch 3  . fluticasone  (FLONASE SENSIMIST ) 27.5 MCG/SPRAY nasal spray Place 1 spray into the nose daily.    . Green Tea, Camellia sinensis, (GREEN TEA PO) Take by mouth daily.    SABRA loratadine (CLARITIN) 10 MG tablet Take 10 mg by mouth at bedtime.    . metoprolol  tartrate (LOPRESSOR ) 50 MG tablet Take 1 tablet (50 mg total) by mouth once for 1 dose. TAKE 2 HOURS PRIOR TO CARDIAC TEST 1 tablet 0  . metroNIDAZOLE  (METROCREAM ) 0.75 % cream Apply 1 Application topically.    . montelukast  (SINGULAIR ) 10 MG tablet TAKE 1 TABLET(10 MG) BY MOUTH AT BEDTIME 90 tablet 1  . progesterone  (PROMETRIUM ) 100 MG capsule Take 1 capsule (100 mg total) by mouth at bedtime. 90 capsule 3  . valACYclovir (VALTREX) 1000 MG tablet Take 1 tablet (1,000 mg total) by mouth 3 (three) times daily. 21 tablet 0  . VITAMIN D  PO Take by mouth daily.     No current facility-administered medications for this visit.   Allergies: Allergies  Allergen Reactions  . Pollen Extract Cough, Itching and Shortness Of Breath  . Dust Mite Extract Itching  . Latex  Other reaction(s): itching  . Tree Extract Itching   I reviewed Shelly Combs past medical history, social history, family history, and environmental history and no significant changes have been reported from Shelly Combs previous visit.  Review of Systems  Constitutional:  Negative for appetite change, chills, fever and unexpected weight change.  HENT:  Negative for rhinorrhea.   Eyes:  Negative for itching.  Respiratory:  Negative for cough, chest tightness, shortness of breath and wheezing.   Cardiovascular:  Negative for chest pain.  Gastrointestinal:  Negative for abdominal pain.  Genitourinary:  Negative for  difficulty urinating.  Skin:  Negative for rash.  Allergic/Immunologic: Positive for environmental allergies.  Neurological:  Negative for headaches.    Objective: LMP 12/31/2017 Comment: sexually active There is no height or weight on file to calculate BMI. Physical Exam Vitals and nursing note reviewed.  Constitutional:      Appearance: Normal appearance. Shelly Combs is well-developed.  HENT:     Head: Normocephalic and atraumatic.     Right Ear: Tympanic membrane and external ear normal.     Left Ear: Tympanic membrane and external ear normal.     Nose: Congestion present.     Mouth/Throat:     Mouth: Mucous membranes are moist.     Pharynx: Oropharynx is clear.  Eyes:     Conjunctiva/sclera: Conjunctivae normal.  Cardiovascular:     Rate and Rhythm: Normal rate and regular rhythm.     Heart sounds: Normal heart sounds. No murmur heard.    No friction rub. No gallop.  Pulmonary:     Effort: Pulmonary effort is normal.     Breath sounds: Normal breath sounds. No wheezing, rhonchi or rales.  Musculoskeletal:     Cervical back: Neck supple.  Skin:    General: Skin is warm.     Findings: No rash.  Neurological:     Mental Status: Shelly Combs is alert and oriented to person, place, and time.  Psychiatric:        Behavior: Behavior normal.   Previous notes and tests were reviewed. The plan was reviewed with the patient/family, and all questions/concerned were addressed.  It was my pleasure to see Shelly Combs today and participate in Shelly Combs care. Please feel free to contact me with any questions or concerns.  Sincerely,  Orlan Cramp, DO Allergy  & Immunology  [facility]  [facility] office: [phone] [facility] office: [phone]

## 2024-07-04 ENCOUNTER — Ambulatory Visit: Admitting: Allergy

## 2024-07-04 ENCOUNTER — Other Ambulatory Visit: Payer: Self-pay

## 2024-07-04 ENCOUNTER — Encounter: Payer: Self-pay | Admitting: Allergy

## 2024-07-04 ENCOUNTER — Telehealth (HOSPITAL_BASED_OUTPATIENT_CLINIC_OR_DEPARTMENT_OTHER): Payer: Self-pay | Admitting: *Deleted

## 2024-07-04 VITALS — BP 114/82 | HR 64 | Temp 98.7°F | Resp 16 | Ht 62.0 in | Wt 157.4 lb

## 2024-07-04 DIAGNOSIS — J4540 Moderate persistent asthma, uncomplicated: Secondary | ICD-10-CM

## 2024-07-04 DIAGNOSIS — J3089 Other allergic rhinitis: Secondary | ICD-10-CM | POA: Diagnosis not present

## 2024-07-04 DIAGNOSIS — T7819XD Other adverse food reactions, not elsewhere classified, subsequent encounter: Secondary | ICD-10-CM | POA: Diagnosis not present

## 2024-07-04 MED ORDER — MONTELUKAST SODIUM 10 MG PO TABS: 10.0000 mg | ORAL_TABLET | Freq: Every day | ORAL | 3 refills | Status: AC

## 2024-07-04 MED ORDER — PULMICORT FLEXHALER 180 MCG/ACT IN AEPB: 1.0000 | INHALATION_SPRAY | Freq: Two times a day (BID) | RESPIRATORY_TRACT | 5 refills | Status: AC

## 2024-07-04 NOTE — Telephone Encounter (Signed)
 Received secure chat from Clotilda Groves CMA with Dr CINDERELLA Cramp. Patient inquiring about sleep study ordered at 9/10 office visit Will forward to sleep team for follow up

## 2024-07-04 NOTE — Telephone Encounter (Signed)
**Note De-Identified Shelly Combs Obfuscation** I have submitted the pts information into the [employer] Portal. They will do the PA and will provide the pt with a device Oddis Westling the mail.  I have sent the pt a MYCHART message advising her of the above.

## 2024-07-04 NOTE — Telephone Encounter (Signed)
 Called patient and made pt aware that PA has been sent to her insurance company and Verneda should be mailed to her home. Made pt aware to call office if there were questions once [employer] has been received. Pt verbalized an understanding.

## 2024-07-04 NOTE — Patient Instructions (Addendum)
 Breathing Normal breathing test today.  Daily controller medication(s): Pulmicort  1 puff twice a day and rinse mouth after each.  Continue Singulair  (montelukast ) 10mg  daily at night. May use Airsupra  rescue inhaler 2 puffs every 4 to 6 hours as needed for shortness of breath, chest tightness, coughing, and wheezing. Do not use more than 12 puffs in 24 hours. May use Airsupra  rescue inhaler 2 puffs 5 to 15 minutes prior to strenuous physical activities. Rinse mouth after each use.  Monitor frequency of use - if you need to use it more than twice per week on a consistent basis let us  know.  Asthma control goals:  Full participation in all desired activities (may need albuterol  before activity) Albuterol  use two times or less a week on average (not counting use with activity) Cough interfering with sleep two times or less a month Oral steroids no more than once a year No hospitalizations   Environmental allergies 2023 skin testing was positive to dust mites.  Continue environmental control measures.  Use over the counter antihistamines such as Zyrtec (cetirizine), Claritin (loratadine), Allegra (fexofenadine), or Xyzal (levocetirizine) daily as needed. May take twice a day during allergy  flares. May switch antihistamines every few months. Continue Singulair  (montelukast ) 10mg  daily at night. May take Flonase Sensamist nasal spray 1 spray per nostril 1-2 times a day as needed for nasal congestion.  Nasal saline spray (i.e., Simply Saline) or nasal saline lavage (i.e., NeilMed) is recommended as needed and prior to medicated nasal sprays. Check on the Odactra expiration date. If still within date, then please schedule for first tablet administration in the office.   Food 2023 skin testing negative to soy and apple.  Continue to avoid foods that are bothersome - soy.  Limit red meat and gluten.   Follow up in 6 months or sooner if needed.  Control of House Dust Mite Allergen Dust  mite allergens are a common trigger of allergy  and asthma symptoms. While they can be found throughout the house, these microscopic creatures thrive in warm, humid environments such as bedding, upholstered furniture and carpeting. Because so much time is spent in the bedroom, it is essential to reduce mite levels there.  Encase pillows, mattresses, and box springs in special allergen-proof fabric covers or airtight, zippered plastic covers.  Bedding should be washed weekly in hot water (130 F) and dried in a hot dryer. Allergen-proof covers are available for comforters and pillows that can't be regularly washed.  Wash the allergy -proof covers every few months. Minimize clutter in the bedroom. Keep pets out of the bedroom.  Keep humidity less than 50% by using a dehumidifier or air conditioning. You can buy a humidity measuring device called a hygrometer to monitor this.  If possible, replace carpets with hardwood, linoleum, or washable area rugs. If that's not possible, vacuum frequently with a vacuum that has a HEPA filter. Remove all upholstered furniture and non-washable window drapes from the bedroom. Remove all non-washable stuffed toys from the bedroom.  Wash stuffed toys weekly.

## 2024-07-28 ENCOUNTER — Encounter (HOSPITAL_BASED_OUTPATIENT_CLINIC_OR_DEPARTMENT_OTHER): Payer: Self-pay | Admitting: Cardiology

## 2024-07-28 DIAGNOSIS — R0683 Snoring: Secondary | ICD-10-CM

## 2024-08-01 ENCOUNTER — Ambulatory Visit: Attending: Cardiology

## 2024-08-01 DIAGNOSIS — R0683 Snoring: Secondary | ICD-10-CM

## 2024-08-01 NOTE — Procedures (Signed)
   SLEEP STUDY REPORT Patient Information Study Date: 07/28/2024 Patient Name: Shelly Combs Patient ID: [id number] Birth Date: 1965/11/27 Age: 58 Gender: Female BMI: 28.8 (W=156 lb, H=5' 2'') Referring Physician: Lonni Nanas, MD  TEST DESCRIPTION: Home sleep apnea testing was completed using the WatchPat, a Type 1 device, utilizing  peripheral arterial tonometry (PAT), chest movement, actigraphy, pulse oximetry, pulse rate, body position and snore.  AHI was calculated with apnea and hypopnea using valid sleep time as the denominator. RDI includes apneas,  hypopneas, and RERAs. The data acquired and the scoring of sleep and all associated events were performed in  accordance with the recommended standards and specifications as outlined in the AASM Manual for the Scoring of  Sleep and Associated Events 2.2.0 (2015).   FINDINGS: 1. No evidence of Obstructive Sleep Apnea with AHI 1.6/hr.  2. No Central Sleep Apnea. 3. Oxygen desaturations as low as 86%. 4. Moderate snoring was present. O2 sats were < 88% for 0 minutes. 5. Total sleep time was 8 hrs and 24 min. 6. 24.3% of total sleep time was spent in REM sleep.  7. Normal sleep onset latency at 15 min.  8. Prolonged REM sleep onset latency at 135 min.  9. Total awakenings were 4.   DIAGNOSIS:  Normal study with no significant sleep disordered breathing.  RECOMMENDATIONS: 1. Normal study with no significant sleep disordered breathing. 2. Healthy sleep recommendations include: adequate nightly sleep (normal 7-9 hrs/night), avoidance of caffeine after  noon and alcohol near bedtime, and maintaining a sleep environment that is cool, dark and quiet. 3. Weight loss for overweight patients is recommended.  4. Snoring recommendations include: weight loss where appropriate, side sleeping, and avoidance of alcohol before  bed. 5. Operation of motor vehicle or dangerous equipment must be avoided when feeling drowsy, excessively  sleepy, or  mentally fatigued.  6. An ENT consultation which may be useful for specific causes of and possible treatment of bothersome snoring .  7. Weight loss may be of benefit in reducing the severity of snoring.   Signature: Wilbert Bihari, MD; [facility]; Diplomat, American Board of Sleep  Medicine Electronically Signed: 08/01/2024 4:18:22 P

## 2024-08-10 ENCOUNTER — Telehealth: Payer: Self-pay | Admitting: *Deleted

## 2024-08-10 NOTE — Telephone Encounter (Signed)
 Patient notified of result via her mychart.Joshua Dalton Seip, CMA 08/10/2024 3:20 PM

## 2024-08-10 NOTE — Telephone Encounter (Signed)
-----   Message from Wilbert Bihari sent at 08/01/2024  4:19 PM EST ----- Please let patient know that sleep study showed no significant sleep apnea.

## 2024-08-10 NOTE — Progress Notes (Unsigned)
 58 y.o. G0P0000 Married Caucasian female here for annual exam.    PCP: Micheal Wolm ORN, MD   Patient's last menstrual period was 12/31/2017.           Sexually active: Yes.    The current method of family planning is post menopausal status.    Menopausal hormone therapy:  Vivelle  & progesterone  Exercising: {yes no:314532}  {types:19826} Smoker:  no  OB History  Gravida Para Term Preterm AB Living  0 0 0 0 0 0  SAB IAB Ectopic Multiple Live Births  0 0 0 0 0     HEALTH MAINTENANCE: Last 2 paps:  04/01/22 neg, 02/02/19 neg  History of abnormal Pap or positive HPV:  no Mammogram:   11/18/23 Breast Density Cat C, BIRADS cat 2 benign  Colonoscopy:  Cologuard - 05/13/23 neg  Bone Density:  n/a  Result  n/a   Immunization History  Administered Date(s) Administered   Influenza Split 08/28/2011, 09/08/2014   Influenza, Seasonal, Injecte, Preservative Fre 06/17/2024   Influenza,inj,Quad PF,6+ Mos 05/29/2015, 06/19/2016, 07/17/2020   Tdap 06/17/2024      reports that she has never smoked. She has never been exposed to tobacco smoke. She has never used smokeless tobacco. She reports current alcohol use of about 2.0 standard drinks of alcohol per week. She reports that she does not use drugs.  Past Medical History:  Diagnosis Date   Allergy     Asthma    Cardiac arrhythmia due to congenital heart disease    Chronic back pain    Uterine fibroid     Past Surgical History:  Procedure Laterality Date   GANGLION CYST EXCISION  1980   HYSTEROSCOPY  1999   uterine fibroids    Current Outpatient Medications  Medication Sig Dispense Refill   AIRSUPRA  90-80 MCG/ACT AERO INHALE 2 PUFFS INTO THE LUNGS EVERY 4 HOURS AS NEEDED FOR COUGHING OR WHEEZING OR CHEST TIGHTNESS. DO NOT EXCEED 12 PUFFS IN 24 HOURS 10.7 g 2   budesonide  (PULMICORT  FLEXHALER) 180 MCG/ACT inhaler Inhale 1 puff into the lungs in the morning and at bedtime. Rinse mouth after each use. 1 each 5   CALCIUM PO Take 1  tablet by mouth daily.     citalopram  (CELEXA ) 20 MG tablet TAKE 1 TABLET(20 MG) BY MOUTH DAILY 90 tablet 1   Dust Mite Mixed Allergen Ext (ODACTRA) 12 SQ-HDM SUBL Place 1 tablet under the tongue daily. First dose must be taken in the allergy  office. (Patient not taking: Reported on 07/04/2024) 30 tablet 5   estradiol  (VIVELLE -DOT) 0.05 MG/24HR patch Place 1 patch (0.05 mg total) onto the skin 2 (two) times a week. 24 patch 3   fluticasone  (FLONASE SENSIMIST ) 27.5 MCG/SPRAY nasal spray Place 1 spray into the nose daily.     Green Tea, Camellia sinensis, (GREEN TEA PO) Take by mouth daily.     loratadine (CLARITIN) 10 MG tablet Take 10 mg by mouth at bedtime.     metoprolol  tartrate (LOPRESSOR ) 50 MG tablet Take 1 tablet (50 mg total) by mouth once for 1 dose. TAKE 2 HOURS PRIOR TO CARDIAC TEST (Patient not taking: Reported on 07/04/2024) 1 tablet 0   metroNIDAZOLE  (METROCREAM ) 0.75 % cream Apply 1 Application topically.     montelukast  (SINGULAIR ) 10 MG tablet Take 1 tablet (10 mg total) by mouth at bedtime. 90 tablet 3   progesterone  (PROMETRIUM ) 100 MG capsule Take 1 capsule (100 mg total) by mouth at bedtime. 90 capsule 3  VITAMIN D  PO Take by mouth daily.     No current facility-administered medications for this visit.    ALLERGIES: Pollen extract, Dust mite extract, Latex, and Tree extract  Family History  Problem Relation Age of Onset   Atrial fibrillation Mother    Heart disease Brother    Cancer Paternal Grandfather        colon   Arthritis Other    Cancer Other        colon   Heart disease Other    Mental illness Other     Review of Systems  PHYSICAL EXAM:  LMP 12/31/2017 Comment: sexually active    General appearance: alert, cooperative and appears stated age Head: normocephalic, without obvious abnormality, atraumatic Neck: no adenopathy, supple, symmetrical, trachea midline and thyroid  normal to inspection and palpation Lungs: clear to auscultation  bilaterally Breasts: normal appearance, no masses or tenderness, No nipple retraction or dimpling, No nipple discharge or bleeding, No axillary adenopathy Heart: regular rate and rhythm Abdomen: soft, non-tender; no masses, no organomegaly Extremities: extremities normal, atraumatic, no cyanosis or edema Skin: skin color, texture, turgor normal. No rashes or lesions Lymph nodes: cervical, supraclavicular, and axillary nodes normal. Neurologic: grossly normal  Pelvic: External genitalia:  no lesions              No abnormal inguinal nodes palpated.              Urethra:  normal appearing urethra with no masses, tenderness or lesions              Bartholins and Skenes: normal                 Vagina: normal appearing vagina with normal color and discharge, no lesions              Cervix: no lesions              Pap taken: {yes no:314532} Bimanual Exam:  Uterus:  normal size, contour, position, consistency, mobility, non-tender              Adnexa: no mass, fullness, tenderness              Rectal exam: {yes no:314532}.  Confirms.              Anus:  normal sphincter tone, no lesions  Chaperone was present for exam:  {BSCHAPERONE:31226::Emily F, CMA}  ASSESSMENT: Well woman visit with gynecologic exam.  PHQ-2-9: ***  ***  PLAN: Mammogram screening discussed. Self breast awareness reviewed. Pap and HRV collected:  {yes no:314532} Guidelines for Calcium, Vitamin D , regular exercise program including cardiovascular and weight bearing exercise. Medication refills:  *** {LABS (Optional):23779} Follow up:  ***    Additional counseling given.  {yes [id number]. ***  total time was spent for this patient encounter, including preparation, face-to-face counseling with the patient, coordination of care, and documentation of the encounter in addition to doing the well woman visit with gynecologic exam.

## 2024-08-11 ENCOUNTER — Ambulatory Visit: Payer: BC Managed Care – PPO | Admitting: Obstetrics and Gynecology

## 2024-08-11 ENCOUNTER — Encounter: Payer: Self-pay | Admitting: Obstetrics and Gynecology

## 2024-08-11 VITALS — BP 124/82 | HR 67 | Ht 62.0 in | Wt 157.0 lb

## 2024-08-11 DIAGNOSIS — Z01419 Encounter for gynecological examination (general) (routine) without abnormal findings: Secondary | ICD-10-CM

## 2024-08-11 DIAGNOSIS — R5383 Other fatigue: Secondary | ICD-10-CM

## 2024-08-11 DIAGNOSIS — Z1331 Encounter for screening for depression: Secondary | ICD-10-CM | POA: Diagnosis not present

## 2024-08-11 MED ORDER — ESTRADIOL 0.05 MG/24HR TD PTTW: 1.0000 | MEDICATED_PATCH | TRANSDERMAL | 3 refills | Status: AC

## 2024-08-11 MED ORDER — PROGESTERONE MICRONIZED 100 MG PO CAPS: 100.0000 mg | ORAL_CAPSULE | Freq: Every day | ORAL | 3 refills | Status: AC

## 2024-08-11 NOTE — Patient Instructions (Signed)

## 2024-08-12 LAB — IRON: Iron: 139 ug/dL (ref 45–160)

## 2024-08-12 LAB — VITAMIN B12: Vitamin B-12: 431 pg/mL (ref 200–1100)

## 2024-08-12 LAB — TSH: TSH: 2.02 m[IU]/L (ref 0.40–4.50)

## 2024-08-12 LAB — VITAMIN D 25 HYDROXY (VIT D DEFICIENCY, FRACTURES): Vit D, 25-Hydroxy: 16 ng/mL — ABNORMAL LOW (ref 30–100)

## 2024-08-12 LAB — FERRITIN: Ferritin: 128 ng/mL (ref 16–232)

## 2024-08-13 ENCOUNTER — Ambulatory Visit: Payer: Self-pay | Admitting: Obstetrics and Gynecology

## 2024-08-13 DIAGNOSIS — R7989 Other specified abnormal findings of blood chemistry: Secondary | ICD-10-CM

## 2024-08-13 MED ORDER — VITAMIN D (ERGOCALCIFEROL) 1.25 MG (50000 UNIT) PO CAPS: 50000.0000 [IU] | ORAL_CAPSULE | ORAL | 0 refills | Status: AC

## 2024-08-14 ENCOUNTER — Other Ambulatory Visit: Payer: Self-pay | Admitting: Obstetrics and Gynecology

## 2024-08-16 NOTE — Telephone Encounter (Addendum)
 Med refill request:   progesterone  (PROMETRIUM ) 100 MG capsule   *Provider already responded to this request by other means, therefore; this request has been refused.

## 2024-08-28 ENCOUNTER — Encounter: Payer: Self-pay | Admitting: Family Medicine

## 2024-10-18 ENCOUNTER — Other Ambulatory Visit

## 2025-01-02 ENCOUNTER — Ambulatory Visit: Admitting: Allergy

## 2025-08-14 ENCOUNTER — Ambulatory Visit: Admitting: Obstetrics and Gynecology
